# Patient Record
Sex: Female | Born: 1990
Health system: Southern US, Community
[De-identification: ages and names within clinical notes are randomized; demographics above are authoritative.]

## PROBLEM LIST (undated history)

## (undated) DIAGNOSIS — E739 Lactose intolerance, unspecified: Secondary | ICD-10-CM

## (undated) DIAGNOSIS — F909 Attention-deficit hyperactivity disorder, unspecified type: Secondary | ICD-10-CM

## (undated) HISTORY — DX: Attention-deficit hyperactivity disorder, unspecified type: F90.9

## (undated) HISTORY — DX: Lactose intolerance, unspecified: E73.9

---
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
## 2016-09-03 ENCOUNTER — Other Ambulatory Visit: Payer: Self-pay | Admitting: Family Medicine

## 2016-09-03 ENCOUNTER — Other Ambulatory Visit (HOSPITAL_COMMUNITY)
Admission: RE | Admit: 2016-09-03 | Discharge: 2016-09-03 | Disposition: A | Discharge status: Home / Self Care | Payer: Self-pay | Source: Ambulatory Visit | Attending: Family Medicine | Admitting: Family Medicine

## 2016-09-03 DIAGNOSIS — Z01419 Encounter for gynecological examination (general) (routine) without abnormal findings: Secondary | ICD-10-CM | POA: Insufficient documentation

## 2016-09-08 LAB — CYTOLOGY - PAP
Adequacy: ABSENT
Diagnosis: NEGATIVE

## 2019-05-24 ENCOUNTER — Encounter (INDEPENDENT_AMBULATORY_CARE_PROVIDER_SITE_OTHER): Payer: Self-pay | Admitting: Family Medicine

## 2019-05-24 ENCOUNTER — Ambulatory Visit (INDEPENDENT_AMBULATORY_CARE_PROVIDER_SITE_OTHER): Payer: No Typology Code available for payment source | Admitting: Family Medicine

## 2019-05-24 ENCOUNTER — Other Ambulatory Visit: Payer: Self-pay

## 2019-05-24 VITALS — BP 125/95 | HR 91 | Temp 98.2°F | Ht 62.0 in | Wt 218.0 lb

## 2019-05-24 DIAGNOSIS — Z0289 Encounter for other administrative examinations: Secondary | ICD-10-CM

## 2019-05-24 DIAGNOSIS — Z9189 Other specified personal risk factors, not elsewhere classified: Secondary | ICD-10-CM | POA: Diagnosis not present

## 2019-05-24 DIAGNOSIS — E559 Vitamin D deficiency, unspecified: Secondary | ICD-10-CM | POA: Diagnosis not present

## 2019-05-24 DIAGNOSIS — R0602 Shortness of breath: Secondary | ICD-10-CM

## 2019-05-24 DIAGNOSIS — R5383 Other fatigue: Secondary | ICD-10-CM | POA: Diagnosis not present

## 2019-05-24 DIAGNOSIS — Z6841 Body Mass Index (BMI) 40.0 and over, adult: Secondary | ICD-10-CM

## 2019-05-24 DIAGNOSIS — Z1331 Encounter for screening for depression: Secondary | ICD-10-CM | POA: Diagnosis not present

## 2019-05-24 NOTE — Progress Notes (Signed)
.  Office: [phone]  /  Fax: [phone]   HPI:   Chief Complaint: OBESITY  Norma SpencerShyla Hayden Cohen (MR# [id number]) is a 28 y.o. female who presents on 05/24/2019 for obesity evaluation and treatment. Current BMI is Body mass index is 39.87 kg/m. Norma Cohen has struggled with obesity for years and has been unsuccessful in either losing weight or maintaining long term weight loss. Norma Cohen attended our information session and states she is currently in the action stage of change and ready to dedicate time achieving and maintaining a healthier weight.   Norma Cohen heard about our clinic from her mom. She states she has gluten and lactose sensitivity. When she wakes up, she is doing Triscuits with mozzarella cheese (unsure of amount), and coffee (felt satisfied) until around 1 pm. For dinner, she is doing a bowl of Honey nut Cheerios with almond milk or a bowl of lucky charms with almond milk (felt full).  Norma Cohen states her family eats meals together she thinks her family will eat healthier with  her she struggles with family and or coworkers weight loss sabotage her desired weight loss is 68 lbs she started gaining weight in junior year of college her heaviest weight ever was 223.6 lbs she has significant food cravings issues  she skips meals frequently she is trying to eat vegetarian she is trying to eat vegan she is frequently drinking liquids with calories she frequently makes poor food choices she has problems with excessive hunger  she frequently eats larger portions than normal  she has binge eating behaviors she struggles with emotional eating    Fatigue Norma Cohen is lower than it should be. This has worsened with weight gain and has not worsened recently. Norma Cohen admits to daytime somnolence and  admits to waking up still tired. Patient is at risk for obstructive sleep apnea. Norma Cohen has a history of symptoms of daytime fatigue. Patient generally gets 6 hours of sleep per night, and  states they generally have nightime awakenings. Snoring is present. Apneic episodes are not present. Epworth Sleepiness Score is 4.  Dyspnea on exertion Norma Cohen notes increasing shortness of breath with exercising and seems to be worsening over time with weight gain. She notes getting out of breath sooner with activity than she used to. This has not gotten worse recently. EKG-normal sinus rhythm at 80 BPM. Norma Cohen denies orthopnea.  Vitamin D Deficiency Norma Cohen has a diagnosis of vitamin D deficiency. She is not currently taking Vit D, and diagnosis is likely secondary to obesity. She notes fatigue and denies nausea, vomiting or muscle weakness.  At risk for osteopenia and osteoporosis Norma Cohen is at higher risk of osteopenia and osteoporosis due to vitamin D deficiency.   Depression Screen Norma Cohen (modified PHQ-9) score was  Depression screen PHQ 2/9 05/24/2019  Decreased Interest 2  Down, Depressed, Hopeless 1  PHQ - 2 Score 3  Altered sleeping 3  Tired, decreased Cohen 3  Change in appetite 2  Feeling bad or failure about yourself  2  Trouble concentrating 2  Moving slowly or fidgety/restless 1  Suicidal thoughts 0  PHQ-9 Score 16  Difficult doing work/chores Somewhat difficult    ASSESSMENT AND PLAN:  Other fatigue - Plan: EKG 12-Lead, Comprehensive metabolic panel, CBC With Differential, Vitamin B12, Folate, Hemoglobin A1c, Insulin, random, Lipid Panel With LDL/HDL Ratio, T3, T4, free, TSH  SOB (shortness of breath) on exertion - Plan: Comprehensive metabolic panel, CBC With Differential, Vitamin B12, Folate, Hemoglobin A1c, Insulin, random,  Lipid Panel With LDL/HDL Ratio, T3, T4, free, TSH  Vitamin D deficiency - Plan: VITAMIN D 25 Hydroxy (Vit-D Deficiency, Fractures)  Depression screening  At risk for osteopenia  Class 3 severe obesity with serious comorbidity and body mass index (BMI) of 40.0 to 44.9 in adult, unspecified obesity type (HCC)  PLAN:  Fatigue  Norma Cohen was informed that her fatigue may be related to obesity, depression or many other causes. Labs will be ordered, and in the meanwhile Norma Cohen has agreed to work on diet, exercise and weight loss to help with fatigue. Proper sleep hygiene was discussed including the need for 7-8 hours of quality sleep each night. A sleep study was not ordered based on symptoms and Epworth score.  Dyspnea on exertion Norma Cohen's shortness of breath appears to be obesity related and exercise induced. She has agreed to work on weight loss and gradually increase exercise to treat her exercise induced shortness of breath. If Norma Cohen follows our instructions and loses weight without improvement of her shortness of breath, we will plan to refer to pulmonology. We will monitor this condition regularly. Norma Cohen agrees to this plan.  Vitamin D Deficiency Norma Cohen was informed that low vitamin D levels contributes to fatigue and are associated with obesity, breast, and colon cancer. She will follow up for routine testing of vitamin D, at least 2-3 times per year. She was informed of the risk of over-replacement of vitamin D and agrees to not increase her dose unless she discusses this with Norma Cohen first. We will check Vit D level today. Norma Cohen agrees to follow up with our clinic in 2 weeks.  At risk for osteopenia and osteoporosis Norma Cohen was given extended (15 minutes) osteoporosis prevention counseling today. Norma Cohen is at risk for osteopenia and osteoporsis due to her vitamin D deficiency. She was encouraged to take her vitamin D and follow her higher calcium diet and increase strengthening exercise to help strengthen her bones and decrease her risk of osteopenia and osteoporosis.  Depression Screen Norma Cohen had a strongly positive depression screening. Depression is commonly associated with obesity and often results in emotional eating behaviors. We will monitor this closely and work on CBT to help improve the non-hunger eating patterns. Referral  to Psychology may be required if no improvement is seen as she continues in our clinic.  Obesity Norma Cohen is currently in the action stage of change and her goal is to continue with weight loss efforts She has agreed to follow the Category 4 plan Norma Cohen has been instructed to work up to a goal of 150 minutes of combined cardio and strengthening exercise per week for weight loss and overall health benefits. We discussed the following Behavioral Modification Strategies today: increasing lean protein intake, increasing vegetables and work on meal planning and easy cooking plans, keeping healthy foods in the home, and planning for success  Norma Cohen has agreed to follow up with our clinic in 2 weeks. She was informed of the importance of frequent follow up visits to maximize her success with intensive lifestyle modifications for her multiple health conditions. She was informed we would discuss her lab results at her next visit unless there is a critical issue that needs to be addressed sooner. Norma Cohen agreed to keep her next visit at the agreed upon time to discuss these results.  ALLERGIES: Allergies  Allergen Reactions  . Gluten Meal   . Lactose Intolerance (Gi)   . Penicillins Rash    MEDICATIONS: Current Outpatient Medications on File Prior to Visit  Medication  Sig Dispense Refill  . ibuprofen (ADVIL) 200 MG tablet Take 200 mg by mouth every 6 (six) hours as needed.    . loratadine (CLARITIN) 10 MG tablet Take 10 mg by mouth daily.     No current facility-administered medications on file prior to visit.     PAST MEDICAL HISTORY: Past Medical History:  Diagnosis Date  . ADHD (attention deficit hyperactivity disorder)   . Lactose intolerance     PAST SURGICAL HISTORY: History reviewed. No pertinent surgical history.  SOCIAL HISTORY: Social History   Tobacco Use  . Smoking status: Never Smoker  . Smokeless tobacco: Never Used  Substance Use Topics  . Alcohol use: Not on file  . Drug  use: Not on file    FAMILY HISTORY: Family History  Problem Relation Age of Onset  . Diabetes Mother   . Anxiety disorder Mother   . Obesity Mother   . Diabetes Father   . Hypertension Father   . Cancer Father   . Obesity Father     ROS: Review of Systems  Constitutional: Positive for malaise/fatigue. Negative for weight loss.       + Trouble sleeping  HENT:       + Nasal stuffiness  Respiratory: Positive for shortness of breath (with exertion).   Cardiovascular: Negative for orthopnea.  Gastrointestinal: Negative for nausea and vomiting.  Musculoskeletal: Positive for back pain.       Negative muscle weakness + Neck stiffness + Muscle stiffness  Psychiatric/Behavioral:       + Stress    PHYSICAL EXAM: Blood pressure (!) 125/95, pulse 91, temperature 98.2 F (36.8 C), temperature source Oral, height 5\' 2"  (1.575 m), weight 218 lb (98.9 kg), last menstrual period 05/09/2019, SpO2 98 %. Body mass index is 39.87 kg/m. Physical Exam Vitals signs reviewed.  Constitutional:      Appearance: Normal appearance. She is obese.  HENT:     Head: Normocephalic and atraumatic.     Nose: Nose normal.  Eyes:     General: No scleral icterus.    Extraocular Movements: Extraocular movements intact.  Neck:     Musculoskeletal: Normal range of motion and neck supple.     Comments: No thyromegaly present Cardiovascular:     Rate and Rhythm: Normal rate and regular rhythm.     Pulses: Normal pulses.     Heart sounds: Normal heart sounds.  Pulmonary:     Effort: Pulmonary effort is normal. No respiratory distress.     Breath sounds: Normal breath sounds.  Abdominal:     Palpations: Abdomen is soft.     Tenderness: There is no abdominal tenderness.     Comments: + Obesity  Musculoskeletal: Normal range of motion.     Right lower leg: No edema.     Left lower leg: No edema.  Skin:    General: Skin is warm and dry.  Neurological:     Mental Status: She is alert and oriented to  person, place, and time.     Coordination: Coordination normal.  Psychiatric:        Cohen and Affect: Cohen normal.        Behavior: Behavior normal.     RECENT LABS AND TESTS: BMET No results found for: NA, K, CL, CO2, GLUCOSE, BUN, CREATININE, CALCIUM, GFRNONAA, GFRAA No results found for: HGBA1C No results found for: INSULIN CBC No results found for: WBC, RBC, HGB, HCT, PLT, MCV, MCH, MCHC, RDW, LYMPHSABS, MONOABS, EOSABS, BASOSABS Iron/TIBC/Ferritin/ %Sat No  results found for: IRON, TIBC, FERRITIN, IRONPCTSAT Lipid Panel  No results found for: CHOL, TRIG, HDL, CHOLHDL, VLDL, LDLCALC, LDLDIRECT Hepatic Function Panel  No results found for: PROT, ALBUMIN, AST, ALT, ALKPHOS, BILITOT, BILIDIR, IBILI No results found for: TSH Vitamin D No recent labs  ECG  shows NSR with a rate of 80 BPM INDIRECT CALORIMETER done today shows a VO2 of 322 and a REE of 2243. Her calculated basal metabolic rate is [id number] thus her basal metabolic rate is better than expected.       OBESITY BEHAVIORAL INTERVENTION VISIT  Today's visit was # 1   Starting weight: 218 lbs Starting date: 05/24/2019 Today's weight : 218 lbs Today's date: 05/24/2019 Total lbs lost to date: 0    ASK: We discussed the diagnosis of obesity with Norma SpencerShyla Hayden Langsam today and Norma Cohen agreed to give Norma Cohen permission to discuss obesity behavioral modification therapy today.  ASSESS: Norma Cohen has the diagnosis of obesity and her BMI today is 39.86 Norma Cohen is in the action stage of change   ADVISE: Norma Cohen was educated on the multiple health risks of obesity as well as the benefit of weight loss to improve her health. She was advised of the need for long term treatment and the importance of lifestyle modifications to improve her current health and to decrease her risk of future health problems.  AGREE: Multiple dietary modification options and treatment options were discussed and  Norma Cohen agreed to follow the recommendations  documented in the above note.  ARRANGE: Norma Cohen was educated on the importance of frequent visits to treat obesity as outlined per CMS and USPSTF guidelines and agreed to schedule her next follow up appointment today.   I, Burt KnackSharon Martin, am acting as transcriptionist for Debbra RidingAlexandria Kadolph, MD   I have reviewed the above documentation for accuracy and completeness, and I agree with the above. - Debbra RidingAlexandria Kadolph, MD

## 2019-05-25 LAB — FOLATE

## 2019-05-29 LAB — LIPID PANEL WITH LDL/HDL RATIO

## 2019-05-29 LAB — T4, FREE

## 2019-05-29 LAB — VITAMIN B12

## 2019-05-29 LAB — CBC WITH DIFFERENTIAL
Basophils Absolute: 0 x10E3/uL (ref 0.0–0.2)
Basos: 0 %
EOS (ABSOLUTE): 0.1 x10E3/uL (ref 0.0–0.4)
Eos: 1 %
Hematocrit: 42.2 % (ref 34.0–46.6)
Hemoglobin: 13.9 g/dL (ref 11.1–15.9)
Immature Grans (Abs): 0 x10E3/uL (ref 0.0–0.1)
Immature Granulocytes: 0 %
Lymphocytes Absolute: 1.8 x10E3/uL (ref 0.7–3.1)
Lymphs: 19 %
MCH: 27.9 pg (ref 26.6–33.0)
MCHC: 32.9 g/dL (ref 31.5–35.7)
MCV: 85 fL (ref 79–97)
Monocytes Absolute: 0.5 x10E3/uL (ref 0.1–0.9)
Monocytes: 5 %
Neutrophils Absolute: 7.2 x10E3/uL — ABNORMAL HIGH (ref 1.4–7.0)
Neutrophils: 75 %
RBC: 4.99 x10E6/uL (ref 3.77–5.28)
RDW: 13.3 % (ref 11.7–15.4)
WBC: 9.7 x10E3/uL (ref 3.4–10.8)

## 2019-05-29 LAB — COMPREHENSIVE METABOLIC PANEL WITH GFR

## 2019-05-29 LAB — T3

## 2019-05-29 LAB — HEMOGLOBIN A1C
Est. average glucose Bld gHb Est-mCnc: 105 mg/dL
Hgb A1c MFr Bld: 5.3 % (ref 4.8–5.6)

## 2019-05-29 LAB — INSULIN, RANDOM

## 2019-05-29 LAB — TSH

## 2019-05-29 LAB — VITAMIN D 25 HYDROXY (VIT D DEFICIENCY, FRACTURES)

## 2019-05-30 NOTE — Progress Notes (Signed)
Office: [phone]  /  Fax: [phone]    Date: June 01, 2019   Appointment Start Time: 11:00am Duration: 35 minutes Provider: Glennie Isle, Psy.D. Type of Session: Intake for Individual Therapy  Location of Patient: Home Location of Provider: Provider's Home Type of Contact: Telepsychological Visit via [employer] WebEx  Informed Consent: Prior to proceeding with today's appointment, two pieces of identifying information were obtained from [name] to verify identity. In addition, Norma Cohen's physical location at the time of this appointment was obtained. Norma Cohen reported she was at home and provided the address. In the event of technical difficulties, Norma Cohen shared a phone number she could be reached at. Norma Cohen and this provider participated in today's telepsychological service. Also, Norma Cohen denied anyone else being present in the room or on the WebEx appointment.   The provider's role was explained to [name]. The provider reviewed and discussed issues of confidentiality, privacy, and limits therein (e.g., reporting obligations). In addition to verbal informed consent, written informed consent for psychological services was obtained from [name] prior to the initial intake interview. Written consent included information concerning the practice, financial arrangements, and confidentiality and patients' rights. Since the clinic is not a 24/7 crisis center, mental health emergency resources were shared, and the provider explained MyChart, e-mail, voicemail, and/or other messaging systems should be utilized only for non-emergency reasons. This provider also explained that information obtained during appointments will be placed in [name] medical record in a confidential manner and relevant information will be shared with other providers at [facility] that she meets with for coordination of care. Norma Cohen verbally acknowledged understanding of the aforementioned, and agreed to use mental  health emergency resources discussed if needed. Moreover, Norma Cohen agreed information may be shared with other [facility] providers as needed for coordination of care. By signing the service agreement document, Norma Cohen provided written consent for coordination of care.   Prior to initiating telepsychological services, Norma Cohen was provided with an informed consent document, which included the development of a safety plan (i.e., an emergency contact and emergency resources) in the event of an emergency/crisis. Norma Cohen expressed understanding of the rationale of the safety plan and provided consent for this provider to reach out to her emergency contact in the event of an emergency/crisis. Norma Cohen returned the completed consent form prior to today's appointment. This provider verbally reviewed the consent form during today's appointment prior to proceeding with the appointment. Norma Cohen verbally acknowledged understanding that she is ultimately responsible for understanding her insurance benefits as it relates to reimbursement of telepsychological and in-person services. This provider also reviewed confidentiality, as it relates to telepsychological services, as well as the rationale for telepsychological services. More specifically, this provider's clinic is limiting in-person visits due to COVID-19. Therapeutic services will resume to in-person appointments once deemed appropriate. Norma Cohen expressed understanding regarding the rationale for telepsychological services. In addition, this provider explained the telepsychological services informed consent document would be considered an addendum to the initial consent document/service agreement. Norma Cohen verbally consented to proceed.   Chief Complaint/HPI: Norma Cohen was referred by Dr. Ilene Qua. During the initial appointment with Dr. Ilene Qua at [name] on May 24, 2019, Norma Cohen reported experiencing the following: significant food  cravings issues , frequently drinking liquids with calories, frequently making poor food choices, frequently eating larger portions than normal , binge eating behaviors, struggling with emotional eating, skipping meals frequently and having problems with excessive hunger.   During today's appointment, Norma Cohen was verbally administered a  questionnaire assessing various behaviors related to emotional eating. Norma Cohen endorsed the following: overeat when you are celebrating, experience food cravings on a regular basis, eat certain foods when you are anxious, stressed, depressed, or your feelings are hurt, use food to help you cope with emotional situations, find food is comforting to you, overeat when you are worried about something, overeat frequently when you are bored or lonely and eat as a reward. She shared she craves sweets and salty snacks (e.g., ice cream and candies). Norma Cohen stated the onset of emotional eating was during the second year of college due to mental health concerns. Currently, she described the frequency of emotional eating as "every day" when the pandemic started. She denied current engagement in emotional eating. In addition, Norma Cohen endorsed a history of binge eating. Norma Cohen indicated it was "mainly in college." She recalled sitting down with boxes of foods and "eating way more than [she] intended to." Norma Cohen denied feeling out of control when engaging in the aforementioned. Norma Cohen denied a history of restricting food intake, purging and engagement in other compensatory strategies, and has never been diagnosed with an eating disorder. She also denied a history of treatment for emotional eating. Moreover, Norma Cohen indicated stress of any kind, sadness, and loneliness triggers emotional eating, whereas calling her parents or boyfriend makes emotional eating better. Furthermore, Norma Cohen denied other problems of concern.    Mental Status Examination:  Appearance: neat Behavior: cooperative Mood:  euthymic Affect: mood congruent Speech: normal in rate, volume, and tone Eye Contact: appropriate Psychomotor Activity: appropriate Thought Process: linear, logical, and goal directed  Content/Perceptual Disturbances: denies suicidal and homicidal ideation, plan, and intent and no hallucinations, delusions, bizarre thinking or behavior reported or observed Orientation: time, person, place and purpose of appointment Cognition/Sensorium: memory, attention, language, and fund of knowledge intact  Insight: good Judgment: good  Family & Psychosocial History: Imberly reported she has a boyfriend and does not have any children. She indicated she is currently not employed. Additionally, Milanni shared her highest level of education obtained is a bachelor's degree. Currently, 25 social support system consists of her brothers, parents, boyfriend, and best friend. Moreover, Aireona stated she resides with her parents and brother.   Medical History:  Past Medical History:  Diagnosis Date   ADHD (attention deficit hyperactivity disorder)    Lactose intolerance    No past surgical history on file. Current Outpatient Medications on File Prior to Visit  Medication Sig Dispense Refill   ibuprofen (ADVIL) 200 MG tablet Take 200 mg by mouth every 6 (six) hours as needed.     loratadine (CLARITIN) 10 MG tablet Take 10 mg by mouth daily.     No current facility-administered medications on file prior to visit.   Icey reported suffering a concussion in 2013 while playing soccer. She denied LOC and received medical attention.   Mental Health History: Jackalynn reported experiencing anxiety in social situations and met with a therapist while in college. She clarified that was around 2014 until 2016. Zoraida noted, "It was only when I felt icky I would go see him." Also, she shared she was diagnosed by a family doctor with AD/HD while in high school. Adisynn denied a history of hospitalizations for psychiatric  concerns, and has never met with a psychiatrist. Alla reported she took Vyvanse during high school, but discontinued it in college. Lanetta endorsed a family history of mental health related concerns. More specifically, Gabbriella believes her father suffers from an anxiety disorder and takes medication. She believes  her paternal grandfather suffers from depression. Joycelin denied a trauma history, including psychological, physical  and sexual abuse, as well as neglect.   Atira described her typical mood as "pretty good." She added, "Lately it has been stress and anxiousness" due to fiances and lack of employment. Aside from concerns noted above and endorsed on the PHQ-9 and GAD-7, Eusebia reported experiencing decreased body image due to her weight, decreased motivation, and crying spells. Lilian endorsed current alcohol use. More specifically, she consumes alcohol "once or twice a month" with friends in the form of approximately 3 standard drinks. She denied current tobacco use. She denied illicit/recreational substance use. Regarding caffeine intake, Labrenda reported consuming 1 cup of coffee daily. Furthermore, Lorry denied experiencing the following: hopelessness, hallucinations and delusions, paranoia, mania and panic attacks. She endorsed a history of panic attacks, but denied recent panic attacks. She also denied history of and current suicidal ideation, plan, and intent; history of and current homicidal ideation, plan, and intent; and history of and current engagement in self-harm.  The following strengths were reported by [facility]: very empathic, very compassionate, love very openly, very kind to other people, and very friendly person. The following strengths were observed by this provider: ability to express thoughts and feelings during the therapeutic session, ability to establish and benefit from a therapeutic relationship, ability to learn and practice coping skills, willingness to work toward established goal(s)  with the clinic and ability to engage in reciprocal conversation.  Legal History: Kylei denied a history of legal involvement.   Structured Assessment Results: The Patient Health Questionnaire-9 (PHQ-9) is a self-report measure that assesses symptoms and severity of depression over the course of the last two weeks. Glori obtained a score of 8 suggesting mild depression. Leili finds the endorsed symptoms to be somewhat difficult. Little interest or pleasure in doing things 1  Feeling down, depressed, or hopeless 1  Trouble falling or staying asleep, or sleeping too much 2  Feeling tired or having little energy 1  Poor appetite or overeating 0  Feeling bad about yourself --- or that you are a failure or have let yourself or your family down 0  Trouble concentrating on things, such as reading the newspaper or watching television 2  Moving or speaking so slowly that other people could have noticed? Or the opposite --- being so fidgety or restless that you have been moving around a lot more than usual 1  Thoughts that you would be better off dead or hurting yourself in some way 0  PHQ-9 Score 8    The Generalized Anxiety Disorder-7 (GAD-7) is a brief self-report measure that assesses symptoms of anxiety over the course of the last two weeks. Keina obtained a score of 4 suggesting minimal anxiety. Jenita finds the endorsed symptoms to be somewhat difficult. Feeling nervous, anxious, on edge 0  Not being able to stop or control worrying 0  Worrying too much about different things 1  Trouble relaxing 2  Being so restless that it's hard to sit still 0  Becoming easily annoyed or irritable 1  Feeling afraid as if something awful might happen 0  GAD-7 Score 4   Interventions: A chart review was conducted prior to the clinical intake interview. The PHQ-9, and GAD-7 were verbally administered as well as a Mood and Food questionnaire to assess various behaviors related to emotional eating. Throughout  session, empathic reflections and validation was provided. Continuing treatment with this provider was discussed and a treatment goal was established.  Psychoeducation regarding emotional versus physical hunger was provided. Milaina was sent a handout via e-mail to utilize between now and the next appointment to increase awareness of hunger patterns and subsequent eating. Ellyce provided verbal consent during today's appointment for this provider to send the handout via e-mail.  Provisional DSM-5 Diagnosis: 311 (F32.8) Other Specified Depressive Disorder, Emotional Eating Behaviors  Plan: Shelene appears able and willing to participate as evidenced by collaboration on a treatment goal, engagement in reciprocal conversation, and asking questions as needed for clarification. The next appointment will be scheduled in three weeks, which will be via [employer]. The following treatment goal was established: decrease emotional eating. For the aforementioned goal, Retta can benefit from individual therapy sessions that are brief in duration for approximately four to six sessions. The treatment modality will be individual therapeutic services, including an eclectic therapeutic approach utilizing techniques from Cognitive Behavioral Therapy, Patient Centered Therapy, Dialectical Behavior Therapy, Acceptance and Commitment Therapy, Interpersonal Therapy, and Cognitive Restructuring. Therapeutic approach will include various interventions as appropriate, such as validation, support, mindfulness, thought defusion, reframing, psychoeducation, values assessment, and role playing. This provider will regularly review the treatment plan and medical chart to keep informed of status changes. Debrah expressed understanding and agreement with the initial treatment plan of care.

## 2019-06-01 ENCOUNTER — Other Ambulatory Visit: Payer: Self-pay

## 2019-06-01 ENCOUNTER — Ambulatory Visit (INDEPENDENT_AMBULATORY_CARE_PROVIDER_SITE_OTHER): Payer: No Typology Code available for payment source | Admitting: Psychology

## 2019-06-01 DIAGNOSIS — F3289 Other specified depressive episodes: Secondary | ICD-10-CM

## 2019-06-07 ENCOUNTER — Ambulatory Visit (INDEPENDENT_AMBULATORY_CARE_PROVIDER_SITE_OTHER): Payer: No Typology Code available for payment source | Admitting: Family Medicine

## 2019-06-07 ENCOUNTER — Other Ambulatory Visit: Payer: Self-pay

## 2019-06-07 VITALS — BP 128/75 | HR 80 | Temp 97.8°F | Ht 62.0 in | Wt 217.0 lb

## 2019-06-07 DIAGNOSIS — Z6839 Body mass index (BMI) 39.0-39.9, adult: Secondary | ICD-10-CM

## 2019-06-07 DIAGNOSIS — E88819 Insulin resistance, unspecified: Secondary | ICD-10-CM

## 2019-06-07 DIAGNOSIS — E8881 Metabolic syndrome: Secondary | ICD-10-CM

## 2019-06-07 DIAGNOSIS — R5383 Other fatigue: Secondary | ICD-10-CM | POA: Diagnosis not present

## 2019-06-07 DIAGNOSIS — E559 Vitamin D deficiency, unspecified: Secondary | ICD-10-CM

## 2019-06-07 DIAGNOSIS — R0602 Shortness of breath: Secondary | ICD-10-CM | POA: Diagnosis not present

## 2019-06-07 DIAGNOSIS — Z9189 Other specified personal risk factors, not elsewhere classified: Secondary | ICD-10-CM | POA: Diagnosis not present

## 2019-06-08 NOTE — Progress Notes (Signed)
Office: [phone]  /  Fax: [phone]   HPI:   Chief Complaint: OBESITY Norma Cohen is here to discuss her progress with her obesity treatment plan. She is on the  follow the Category 4 plan and is following her eating plan approximately 90 % of the time. She states she is exercising by walking at work for 5-7 minutes 4 times per week. [name] just started a new part time job at [employer]. She is getting approximately 24 hours per week. She is unable to get all food in at dinner, she can only get approximately 6 oz. She denies cravings.  Her weight is 217 lb (98.4 kg) today and has had a weight loss of 1 pounds over a period of 2 weeks since her last visit. She has lost 1 lbs since starting treatment with [place].  Vitamin D deficiency Johann has a diagnosis of vitamin D deficiency. She is not currently taking vit D and denies nausea, vomiting or muscle weakness. Previous labs cancelled.   Insulin Resistance Yvone has a diagnosis of insulin resistance based on her elevated fasting hgA1c of 5.3. Although Girtha's blood glucose readings are still under good control, insulin resistance puts her at greater risk of metabolic syndrome and diabetes. She is not taking metformin currently and continues to work on diet and exercise to decrease risk of diabetes. Previous Insulin labs cancelled.   Dyspnea with Exercise Zarielle notes increasing shortness of breath with exercising and seems to be worsening over time with weight gain. She notes getting out of breath sooner with activity than she used to. This has not gotten worse recently. [name] denies shortness of breath at rest or orthopnea. EKG shows normal sinus rhythm.   At risk for osteopenia and osteoporosis [name] is at higher risk of osteopenia and osteoporosis due to vitamin D deficiency.   ASSESSMENT AND PLAN:  Vitamin D deficiency - Plan: VITAMIN D 25 Hydroxy (Vit-D Deficiency, Fractures), Vitamin B12  Insulin resistance - Plan: Comprehensive  metabolic panel, Insulin, random  Shortness of breath on exertion - Plan: Lipid Panel With LDL/HDL Ratio  Other fatigue - Plan: Vitamin B12  At risk for osteoporosis  Class 2 severe obesity with serious comorbidity and body mass index (BMI) of 39.0 to 39.9 in adult, unspecified obesity type (HCC)  PLAN: Vitamin D Deficiency [name] was informed that low vitamin D levels contributes to fatigue and are associated with obesity, breast, and colon cancer. She agrees to continue to take prescription Vit D @50 ,000 IU every week and will follow up for routine testing of vitamin D, at least 2-3 times per year. She was informed of the risk of over-replacement of vitamin D and agrees to not increase her dose unless she discusses this with [place] first. We will check vit D level today   Insulin Resistance [name] will continue to work on weight loss, exercise, and decreasing simple carbohydrates in her diet to help decrease the risk of diabetes. We dicussed metformin including benefits and risks. She was informed that eating too many simple carbohydrates or too many calories at one sitting increases the likelihood of GI side effects. [name] agreed to follow up with [place] as directed to monitor her progress. Insulin, CMP, Lipid panel ordered today.   Dyspnea with exercise [name]'s shortness of breath appears to be obesity related and exercise induced. She has agreed to work on weight loss and gradually increase exercise to treat her exercise induced shortness of breath. If [name] follows our instructions and loses weight  without improvement of her shortness of breath, we will plan to refer to pulmonology. [name] agrees to this plan.Vitamin B12, Vitamin D, and Folate ordered today.   At risk for osteopenia and osteoporosis [name] was given extended  (15 minutes) osteoporosis prevention counseling today. [name] is at risk for osteopenia and osteoporosis due to her vitamin D deficiency. She was encouraged to take her vitamin D  and follow her higher calcium diet and increase strengthening exercise to help strengthen her bones and decrease her risk of osteopenia and osteoporosis.  Obesity [name] is currently in the action stage of change. As such, her goal is to continue with weight loss efforts She has agreed to follow the Category 4 plan [name] has been instructed to work up to a goal of 150 minutes of combined cardio and strengthening exercise per week for weight loss and overall health benefits. We discussed the following Behavioral Modification Strategies today:keeping healthy foods in the home, planning for success,  increasing lean protein intake, increasing vegetables and work on meal planning and easy cooking plans   [name] has agreed to follow up with our clinic in 2 weeks. She was informed of the importance of frequent follow up visits to maximize her success with intensive lifestyle modifications for her multiple health conditions.  ALLERGIES: Allergies  Allergen Reactions  . Gluten Meal   . Lactose Intolerance (Gi)   . Penicillins Rash    MEDICATIONS: Current Outpatient Medications on File Prior to Visit  Medication Sig Dispense Refill  . ibuprofen (ADVIL) 200 MG tablet Take 200 mg by mouth every 6 (six) hours as needed.    . loratadine (CLARITIN) 10 MG tablet Take 10 mg by mouth daily.     No current facility-administered medications on file prior to visit.     PAST MEDICAL HISTORY: Past Medical History:  Diagnosis Date  . ADHD (attention deficit hyperactivity disorder)   . Lactose intolerance     PAST SURGICAL HISTORY: No past surgical history on file.  SOCIAL HISTORY: Social History   Tobacco Use  . Smoking status: Never Smoker  . Smokeless tobacco: Never Used  Substance Use Topics  . Alcohol use: Not on file  . Drug use: Not on file    FAMILY HISTORY: Family History  Problem Relation Age of Onset  . Diabetes Mother   . Anxiety disorder Mother   . Obesity Mother   .  Diabetes Father   . Hypertension Father   . Cancer Father   . Obesity Father     ROS: Review of Systems  Constitutional: Positive for weight loss.  Respiratory: Positive for shortness of breath.   Gastrointestinal: Negative for nausea and vomiting.  Musculoskeletal:       Negative for muscle weakness     PHYSICAL EXAM: Blood pressure 128/75, pulse 80, temperature 97.8 F (36.6 C), temperature source Oral, height 5\' 2"  (1.575 m), weight 217 lb (98.4 kg), last menstrual period 06/07/2019, SpO2 98 %. Body mass index is 39.69 kg/m. Physical Exam Vitals signs reviewed.  Constitutional:      Appearance: Normal appearance. She is obese.  HENT:     Head: Normocephalic.     Nose: Nose normal.  Neck:     Musculoskeletal: Normal range of motion.  Cardiovascular:     Rate and Rhythm: Normal rate.  Pulmonary:     Effort: Pulmonary effort is normal.  Musculoskeletal: Normal range of motion.  Skin:    General: Skin is warm and dry.  Neurological:  Mental Status: She is alert and oriented to person, place, and time.  Psychiatric:        Mood and Affect: Mood normal.        Behavior: Behavior normal.     RECENT LABS AND TESTS: BMET    Component Value Date/Time   GLUCOSE CANCELED 05/24/2019 1434   Lab Results  Component Value Date   HGBA1C 5.3 05/24/2019   Lab Results  Component Value Date   INSULIN CANCELED 05/24/2019   CBC    Component Value Date/Time   WBC 9.7 05/24/2019 1434   RBC 4.99 05/24/2019 1434   HGB 13.9 05/24/2019 1434   HCT 42.2 05/24/2019 1434   MCV 85 05/24/2019 1434   MCH 27.9 05/24/2019 1434   MCHC 32.9 05/24/2019 1434   RDW 13.3 05/24/2019 1434   LYMPHSABS 1.8 05/24/2019 1434   EOSABS 0.1 05/24/2019 1434   BASOSABS 0.0 05/24/2019 1434   Iron/TIBC/Ferritin/ %Sat No results found for: IRON, TIBC, FERRITIN, IRONPCTSAT Lipid Panel     Component Value Date/Time   CHOL CANCELED 05/24/2019 1434   Hepatic Function Panel  No results found  for: PROT, ALBUMIN, AST, ALT, ALKPHOS, BILITOT, BILIDIR, IBILI    Component Value Date/Time   TSH CANCELED 05/24/2019 1434      OBESITY BEHAVIORAL INTERVENTION VISIT  Today's visit was # 2   Starting weight: 218 lb Starting date: 05/24/19 Today's weight : Weight: 217 lb (98.4 kg)  Today's date: 06/07/19 Total lbs lost to date: 1 At least 15 minutes were spent on discussing the following behavioral intervention visit.   ASK: We discussed the diagnosis of obesity with Junie SpencerShyla Hayden Dipinto today and Dameka agreed to give us permission to discuss obesity behavioral modification therapy today.  ASSESS: [name] has the diagnosis of obesity and her BMI today is 39.68 Breyah is in the action stage of change   ADVISE: [name] was educated on the multiple health risks of obesity as well as the benefit of weight loss to improve her health. She was advised of the need for long term treatment and the importance of lifestyle modifications to improve her current health and to decrease her risk of future health problems.  AGREE: Multiple dietary modification options and treatment options were discussed and  Adiel agreed to follow the recommendations documented in the above note.  ARRANGE: [name] was educated on the importance of frequent visits to treat obesity as outlined per CMS and USPSTF guidelines and agreed to schedule her next follow up appointment today.  I, Jeralene PetersAshleigh Haynes, am acting as transcriptionist for Debbra RidingAlexandria Kadolph, MD   I have reviewed the above documentation for accuracy and completeness, and I agree with the above. - Debbra RidingAlexandria Kadolph, MD

## 2019-06-14 NOTE — Progress Notes (Signed)
Office: [phone]  /  Fax: [phone]    Date: June 22, 2019   Appointment Start Time: 10:00am Duration: 27 minutes Provider: Lawerance CruelGaytri Fenton Candee, Psy.D. Type of Session: Individual Therapy  Location of Patient: Home Location of Provider: [facility] Type of Contact: Telepsychological Visit via [employer]   Session Content: Norma Cohen is a 28 y.o. female presenting via [employer] Web[employer] for a follow-up appointment to address the previously established treatment goal of decreasing emotional eating. Today's appointment was a telepsychological visit, as this provider's clinic is seeing a limited number of patients for in-person visits due to COVID-19. Therapeutic services will resume to in-person appointments once deemed appropriate. Norma Cohen expressed understanding regarding the rationale for telepsychological services, and provided verbal consent for today's appointment. Prior to proceeding with today's appointment, Norma Cohen's physical location at the time of this appointment was obtained. Norma Cohen reported she was at home and provided the address. In the event of technical difficulties, Norma Cohen shared a phone number she could be reached at. Norma Cohen and this provider participated in today's telepsychological service. Also, Norma Cohen denied anyone else being present in the room or on the WebEx appointment.  This provider conducted a brief check-in and verbally administered the PHQ-9 and GAD-7. Norma Cohen shared, "I have a job now." She added, "I've been a busy little bee." She also reflected there was a day where she "cried for 2 hours" as she felt she was not where she wanted to be in life. She added, "I feel a lot better now." Elysabeth further reflected "there was day in particular" that she was stressed, and she experienced emotional hunger. Her increased awareness was positively reinforced.  Psychoeducation regarding triggers for emotional eating was provided. Norma Cohen was provided a handout, and encouraged to  utilize the handout between now and the next appointment to increase awareness of triggers and frequency. Norma Cohen agreed. This provider also discussed behavioral strategies for specific triggers, such as placing the utensil down when conversing to avoid mindless eating. Norma Cohen provided verbal consent during today's appointment for this provider to send the handout about triggers via e-mail. Norma Cohen was receptive to today's session as evidenced by openness to sharing, responsiveness to feedback, and willingness to explore triggers for emotional eating.  Mental Status Examination:  Appearance: neat Behavior: cooperative Mood: euthymic Affect: mood congruent Speech: normal in rate, volume, and tone Eye Contact: appropriate Psychomotor Activity: appropriate Thought Process: linear, logical, and goal directed  Content/Perceptual Disturbances: no hallucinations, delusions, bizarre thinking or behavior reported or observed and no evidence of suicidal and homicidal ideation, plan, and intent Orientation: time, person, place and purpose of appointment Cognition/Sensorium: memory, attention, language, and fund of knowledge intact  Insight: good Judgment: good  Structured Assessment Results: The Patient Health Questionnaire-9 (PHQ-9) is a self-report measure that assesses symptoms and severity of depression over the course of the last two weeks. Norma Cohen obtained a score of 4 suggesting minimal depression. Norma Cohen finds the endorsed symptoms to be somewhat difficult. Little interest or pleasure in doing things 0  Feeling down, depressed, or hopeless 0  Trouble falling or staying asleep, or sleeping too much 1  Feeling tired or having little energy 1  Poor appetite or overeating 0  Feeling bad about yourself --- or that you are a failure or have let yourself or your family down 1  Trouble concentrating on things, such as reading the newspaper or watching television 1  Moving or speaking so slowly that other  people could have noticed? Or the opposite ---  being so fidgety or restless that you have been moving around a lot more than usual 0  Thoughts that you would be better off dead or hurting yourself in some way 0  PHQ-9 Score 4    The Generalized Anxiety Disorder-7 (GAD-7) is a brief self-report measure that assesses symptoms of anxiety over the course of the last two weeks. Norma Cohen obtained a score of 1 suggesting minimal anxiety. Norma Cohen finds the endorsed symptoms to be not difficult at all. Feeling nervous, anxious, on edge 0  Not being able to stop or control worrying 0  Worrying too much about different things 1  Trouble relaxing 0  Being so restless that it's hard to sit still 0  Becoming easily annoyed or irritable 0  Feeling afraid as if something awful might happen 0  GAD-7 Score 1   Interventions:  Conducted a brief chart review Verbal administration of PHQ-9 and GAD-7 for symptom monitoring Provided empathic reflections and validation Reviewed content from the previous session Psychoeducation provided regarding triggers for emotional eating Provided positive reinforcement Focused on rapport building Employed supportive psychotherapy interventions to facilitate reduced distress, and to improve coping skills with identified stressors  DSM-5 Diagnosis: 311 (F32.8) Other Specified Depressive Disorder, Emotional Eating Behaviors  Treatment Goal & Progress: During the initial appointment with this provider, the following treatment goal was established: decrease emotional eating. Progress is limited, as Norma Cohen has just begun treatment with this provider; however, she is receptive to the interaction and interventions and rapport is being established.   Plan: Norma Cohen continues to appear able and willing to participate as evidenced by engagement in reciprocal conversation, and asking questions for clarification as appropriate. The next appointment will be scheduled in 2-3 weeks, which will be  via [employer]. The next session will focus on the introduction of mindfulness.

## 2019-06-21 ENCOUNTER — Other Ambulatory Visit: Payer: Self-pay

## 2019-06-21 ENCOUNTER — Ambulatory Visit (INDEPENDENT_AMBULATORY_CARE_PROVIDER_SITE_OTHER): Payer: No Typology Code available for payment source | Admitting: Family Medicine

## 2019-06-21 VITALS — BP 133/93 | HR 86 | Temp 97.5°F | Ht 62.0 in | Wt 210.0 lb

## 2019-06-21 DIAGNOSIS — R5383 Other fatigue: Secondary | ICD-10-CM | POA: Diagnosis not present

## 2019-06-21 DIAGNOSIS — Z9189 Other specified personal risk factors, not elsewhere classified: Secondary | ICD-10-CM | POA: Diagnosis not present

## 2019-06-21 DIAGNOSIS — E559 Vitamin D deficiency, unspecified: Secondary | ICD-10-CM | POA: Diagnosis not present

## 2019-06-21 DIAGNOSIS — Z6838 Body mass index (BMI) 38.0-38.9, adult: Secondary | ICD-10-CM

## 2019-06-21 LAB — BASIC METABOLIC PANEL
BUN: 18 (ref 4–21)
Creatinine: 0.6 (ref 0.5–1.1)
Glucose: 91

## 2019-06-21 LAB — LIPID PANEL
Cholesterol: 139 (ref 0–200)
HDL: 37 (ref 35–70)
LDl/HDL Ratio: 2.4

## 2019-06-21 LAB — TSH: TSH: 3.02 (ref 0.41–5.90)

## 2019-06-21 NOTE — Progress Notes (Signed)
Office: [phone]  /  Fax: [phone]   HPI:   Chief Complaint: OBESITY Trenna is here to discuss her progress with her obesity treatment plan. She is on the Category 4 plan and is following her eating plan approximately 90 % of the time. She states she is walking and doing some remodeling. Stefana has been working part time and has found this to be difficult to get adjusted to, secondary to her schedule not being consistent. She notes occasionally skipping meals, and occasional mental hunger especially secondary to cravings. Her weight is 210 lb (95.3 kg) today and has had a weight loss of 7 pounds over a period of 2 weeks since her last visit. She has lost 8 lbs since starting treatment with [place].  Vitamin D Deficiency Margaret has a diagnosis of vitamin D deficiency. She is not currently taking Vit D supplementation. She notes fatigue and denies nausea, vomiting or muscle weakness.  At risk for osteopenia and osteoporosis Jaylen is at higher risk of osteopenia and osteoporosis due to vitamin D deficiency.   Fatigue Maicey voices that she is still experiencing fatigue throughout the day.  ASSESSMENT AND PLAN:  Vitamin D deficiency - Plan: VITAMIN D 25 Hydroxy (Vit-D Deficiency, Fractures)  Other fatigue - Plan: Comprehensive metabolic panel, Hemoglobin A1c, Insulin, random, Vitamin B12, Lipid Panel With LDL/HDL Ratio, T3, T4, free, TSH  At risk for osteoporosis  Class 2 severe obesity with serious comorbidity and body mass index (BMI) of 38.0 to 38.9 in adult, unspecified obesity type (HCC)  PLAN:  Vitamin D Deficiency Kaylene was informed that low vitamin D levels contributes to fatigue and are associated with obesity, breast, and colon cancer. She will follow up for routine testing of vitamin D, at least 2-3 times per year. She was informed of the risk of over-replacement of vitamin D and agrees to not increase her dose unless she discusses this with [place] first. We will check Vit D  level today. Corina agrees to follow up with our clinic in 2 weeks.  At risk for osteopenia and osteoporosis Damyia was given extended (15 minutes) osteoporosis prevention counseling today. Markea is at risk for osteopenia and osteoporsis due to her vitamin D deficiency. She was encouraged to take her vitamin D and follow her higher calcium diet and increase strengthening exercise to help strengthen her bones and decrease her risk of osteopenia and osteoporosis.  Fatigue Andreika was informed that her fatigue may be related to obesity, depression or many other causes. We will check CMP, insulin, thyroid panel, B12 level, and FLP today, and in the meanwhile Maricella has agreed to work on diet, exercise and weight loss to help with fatigue. Proper sleep hygiene was discussed including the need for 7-8 hours of quality sleep each night.   Obesity Korissa is currently in the action stage of change. As such, her goal is to continue with weight loss efforts She has agreed to follow the Category 4 plan Myalee has been instructed to work up to a goal of 150 minutes of combined cardio and strengthening exercise per week for weight loss and overall health benefits. We discussed the following Behavioral Modification Strategies today: increasing lean protein intake, increasing vegetables and work on meal planning and easy cooking plans, no skipping meals, keeping healthy foods in the home, better snacking choices, and planning for success   Deeandra has agreed to follow up with our clinic in 2 weeks. She was informed of the importance of frequent follow up visits  to maximize her success with intensive lifestyle modifications for her multiple health conditions.  ALLERGIES: Allergies  Allergen Reactions  . Gluten Meal   . Lactose Intolerance (Gi)   . Penicillins Rash    MEDICATIONS: Current Outpatient Medications on File Prior to Visit  Medication Sig Dispense Refill  . ibuprofen (ADVIL) 200 MG tablet Take 200 mg  by mouth every 6 (six) hours as needed.    . loratadine (CLARITIN) 10 MG tablet Take 10 mg by mouth daily.     No current facility-administered medications on file prior to visit.     PAST MEDICAL HISTORY: Past Medical History:  Diagnosis Date  . ADHD (attention deficit hyperactivity disorder)   . Lactose intolerance     PAST SURGICAL HISTORY: No past surgical history on file.  SOCIAL HISTORY: Social History   Tobacco Use  . Smoking status: Never Smoker  . Smokeless tobacco: Never Used  Substance Use Topics  . Alcohol use: Not on file  . Drug use: Not on file    FAMILY HISTORY: Family History  Problem Relation Age of Onset  . Diabetes Mother   . Anxiety disorder Mother   . Obesity Mother   . Diabetes Father   . Hypertension Father   . Cancer Father   . Obesity Father     ROS: Review of Systems  Constitutional: Positive for malaise/fatigue and weight loss.  Gastrointestinal: Negative for nausea and vomiting.  Musculoskeletal:       Negative muscle weakness    PHYSICAL EXAM: Blood pressure (!) 133/93, pulse 86, temperature (!) 97.5 F (36.4 C), temperature source Oral, height 5\' 2"  (1.575 m), weight 210 lb (95.3 kg), last menstrual period 06/07/2019, SpO2 99 %. Body mass index is 38.41 kg/m. Physical Exam Vitals signs reviewed.  Constitutional:      Appearance: Normal appearance. She is obese.  Cardiovascular:     Rate and Rhythm: Normal rate.     Pulses: Normal pulses.  Pulmonary:     Effort: Pulmonary effort is normal.     Breath sounds: Normal breath sounds.  Musculoskeletal: Normal range of motion.  Skin:    General: Skin is warm and dry.  Neurological:     Mental Status: She is alert and oriented to person, place, and time.  Psychiatric:        Mood and Affect: Mood normal.        Behavior: Behavior normal.     RECENT LABS AND TESTS: BMET    Component Value Date/Time   GLUCOSE CANCELED 05/24/2019 1434   Lab Results  Component Value  Date   HGBA1C 5.3 05/24/2019   Lab Results  Component Value Date   INSULIN CANCELED 05/24/2019   CBC    Component Value Date/Time   WBC 9.7 05/24/2019 1434   RBC 4.99 05/24/2019 1434   HGB 13.9 05/24/2019 1434   HCT 42.2 05/24/2019 1434   MCV 85 05/24/2019 1434   MCH 27.9 05/24/2019 1434   MCHC 32.9 05/24/2019 1434   RDW 13.3 05/24/2019 1434   LYMPHSABS 1.8 05/24/2019 1434   EOSABS 0.1 05/24/2019 1434   BASOSABS 0.0 05/24/2019 1434   Iron/TIBC/Ferritin/ %Sat No results found for: IRON, TIBC, FERRITIN, IRONPCTSAT Lipid Panel     Component Value Date/Time   CHOL CANCELED 05/24/2019 1434   Hepatic Function Panel  No results found for: PROT, ALBUMIN, AST, ALT, ALKPHOS, BILITOT, BILIDIR, IBILI    Component Value Date/Time   TSH CANCELED 05/24/2019 1434  OBESITY BEHAVIORAL INTERVENTION VISIT  Today's visit was # 3   Starting weight: 218 lbs Starting date: 05/24/2019 Today's weight : 210 lbs Today's date: 06/21/2019 Total lbs lost to date: 8    ASK: We discussed the diagnosis of obesity with Junie Spencer today and Wilder Glade agreed to give [place] permission to discuss obesity behavioral modification therapy today.  ASSESS: Crisel has the diagnosis of obesity and her BMI today is 38.4 Olene is in the action stage of change   ADVISE: Adaly was educated on the multiple health risks of obesity as well as the benefit of weight loss to improve her health. She was advised of the need for long term treatment and the importance of lifestyle modifications to improve her current health and to decrease her risk of future health problems.  AGREE: Multiple dietary modification options and treatment options were discussed and  Michelyn agreed to follow the recommendations documented in the above note.  ARRANGE: Tanee was educated on the importance of frequent visits to treat obesity as outlined per CMS and USPSTF guidelines and agreed to schedule her next follow up appointment  today.  I, Burt Knack, am acting as transcriptionist for Debbra Riding, MD  I have reviewed the above documentation for accuracy and completeness, and I agree with the above. - Debbra Riding, MD

## 2019-06-22 ENCOUNTER — Ambulatory Visit (INDEPENDENT_AMBULATORY_CARE_PROVIDER_SITE_OTHER): Payer: No Typology Code available for payment source | Admitting: Psychology

## 2019-06-22 DIAGNOSIS — F3289 Other specified depressive episodes: Secondary | ICD-10-CM

## 2019-06-22 LAB — LIPID PANEL WITH LDL/HDL RATIO
Cholesterol, Total: 139 mg/dL (ref 100–199)
HDL: 37 mg/dL — ABNORMAL LOW (ref >39)
LDL Chol Calc (NIH): 88 mg/dL (ref 0–99)
LDL/HDL Ratio: 2.4 ratio (ref 0.0–3.2)
Triglycerides: 70 mg/dL (ref 0–149)
VLDL Cholesterol Cal: 14 mg/dL (ref 5–40)

## 2019-06-22 LAB — HEMOGLOBIN A1C
Est. average glucose Bld gHb Est-mCnc: 103 mg/dL
Hgb A1c MFr Bld: 5.2 % (ref 4.8–5.6)

## 2019-06-22 LAB — COMPREHENSIVE METABOLIC PANEL
ALT: 21 IU/L (ref 0–32)
AST: 17 IU/L (ref 0–40)
Albumin/Globulin Ratio: 1.6 (ref 1.2–2.2)
Albumin: 4.7 g/dL (ref 3.9–5.0)
Alkaline Phosphatase: 73 IU/L (ref 39–117)
BUN/Creatinine Ratio: 30 — ABNORMAL HIGH (ref 9–23)
BUN: 18 mg/dL (ref 6–20)
Bilirubin Total: 0.3 mg/dL (ref 0.0–1.2)
CO2: 18 mmol/L — ABNORMAL LOW (ref 20–29)
Calcium: 9.5 mg/dL (ref 8.7–10.2)
Chloride: 104 mmol/L (ref 96–106)
Creatinine, Ser: 0.61 mg/dL (ref 0.57–1.00)
GFR calc Af Amer: 146 mL/min/{1.73_m2} (ref >59)
GFR calc non Af Amer: 126 mL/min/{1.73_m2} (ref >59)
Globulin, Total: 2.9 g/dL (ref 1.5–4.5)
Glucose: 91 mg/dL (ref 65–99)
Potassium: 4.2 mmol/L (ref 3.5–5.2)
Sodium: 141 mmol/L (ref 134–144)
Total Protein: 7.6 g/dL (ref 6.0–8.5)

## 2019-06-22 LAB — T3: T3, Total: 136 ng/dL (ref 71–180)

## 2019-06-22 LAB — INSULIN, RANDOM: INSULIN: 31.7 u[IU]/mL — ABNORMAL HIGH (ref 2.6–24.9)

## 2019-06-22 LAB — TSH: TSH: 3.02 u[IU]/mL (ref 0.450–4.500)

## 2019-06-22 LAB — VITAMIN D 25 HYDROXY (VIT D DEFICIENCY, FRACTURES): Vit D, 25-Hydroxy: 27.9 ng/mL — ABNORMAL LOW (ref 30.0–100.0)

## 2019-06-22 LAB — T4, FREE: Free T4: 1.21 ng/dL (ref 0.82–1.77)

## 2019-06-22 LAB — VITAMIN B12: Vitamin B-12: 420 pg/mL (ref 232–1245)

## 2019-07-03 NOTE — Progress Notes (Signed)
Office: [phone]  /  Fax: [phone]    Date: July 12, 2019   Appointment Start Time: 12:05pm Duration: 25 minutes Provider: Glennie Isle, Psy.D. Type of Session: Individual Therapy  Location of Patient: Home Location of Provider: Provider's Home Type of Contact: Telepsychological Visit via [employer] Web[employer]   Session Content: Norma Cohen is a 28 y.o. female presenting via [place] for a follow-up appointment to address the previously established treatment goal of decreasing emotional eating. Today's appointment was a telepsychological visit, as this provider's clinic is seeing a limited number of patients for in-person visits due to COVID-19. Therapeutic services will resume to in-person appointments once deemed appropriate. Norma Cohen expressed understanding regarding the rationale for telepsychological services, and provided verbal consent for today's appointment. Prior to proceeding with today's appointment, Norma Cohen's physical location at the time of this appointment was obtained. Norma Cohen reported she was at home and provided the address. In the event of technical difficulties, Norma Cohen shared a phone number she could be reached at. Norma Cohen and this provider participated in today's telepsychological service. Also, Norma Cohen denied anyone else being present in the room or on the WebEx appointment.  This provider conducted a brief check-in and verbally administered the PHQ-9 and GAD-7. Norma Cohen shared she obtained employment with [employer], and noted she plans to continue working at [employer] on the weekends. Regarding eating, Norma Cohen reported an increase in snacking, but believes she continues to lose weight. She added she discussed the aforementioned with Dr. Adair Patter during their last appointment (July 04, 2019). She also described making better choices and engaging in portion control. Norma Cohen identified squelch urgency, fatigue, and boredom are frequent triggers for emotional eating.  Psychoeducation regarding mindfulness was provided to assist with coping. A handout was provided to Norma Cohen with further information regarding mindfulness, including exercises. This provider also explained the benefit of mindfulness as it relates to emotional eating. Norma Cohen was encouraged to engage in the provided exercises between now and the next appointment with this provider. Norma Cohen agreed. Norma Cohen was led through an exercise involving her senses. Norma Cohen provided verbal consent during today's appointment for this provider to send the handout on mindfulness via e-mail. Based on overall progress, frequency of appointments will be reduced.  Norma Cohen was receptive to today's session as evidenced by openness to sharing, responsiveness to feedback, and willingness to engage in mindfulness exercises.  Mental Status Examination: Of note, today's appointment was initiated 5 minutes late due to this provider. Appearance: neat Behavior: cooperative Mood: euthymic Affect: mood congruent Speech: normal in rate, volume, and tone Eye Contact: appropriate Psychomotor Activity: appropriate Thought Process: linear, logical, and goal directed  Content/Perceptual Disturbances: no hallucinations, delusions, bizarre thinking or behavior reported or observed and no evidence of suicidal and homicidal ideation, plan, and intent Orientation: time, person, place and purpose of appointment Cognition/Sensorium: memory, attention, language, and fund of knowledge intact  Insight: good Judgment: good  Structured Assessment Results: The Patient Health Questionnaire-9 (PHQ-9) is a self-report measure that assesses symptoms and severity of depression over the course of the last two weeks. Norma Cohen obtained a score of 5 suggesting mild depression. Norma Cohen finds the endorsed symptoms to be somewhat difficult. Little interest or pleasure in doing things 0  Feeling down, depressed, or hopeless 0  Trouble falling or staying asleep, or sleeping  too much 1  Feeling tired or having little energy 2  Poor appetite or overeating 0  Feeling bad about yourself --- or that you are a failure or have let yourself or your  family down 0  Trouble concentrating on things, such as reading the newspaper or watching television 1  Moving or speaking so slowly that other people could have noticed? Or the opposite --- being so fidgety or restless that you have been moving around a lot more than usual 1  Thoughts that you would be better off dead or hurting yourself in some way 0  PHQ-9 Score 5    The Generalized Anxiety Disorder-7 (GAD-7) is a brief self-report measure that assesses symptoms of anxiety over the course of the last two weeks. Norma Cohen obtained a score of 4 suggesting minimal anxiety. Norma Cohen finds the endorsed symptoms to be somewhat difficult. Feeling nervous, anxious, on edge 1  Not being able to stop or control worrying 0  Worrying too much about different things 1  Trouble relaxing 1  Being so restless that it's hard to sit still 1  Becoming easily annoyed or irritable 0  Feeling afraid as if something awful might happen 0  GAD-7 Score 4   Interventions:  Conducted a brief chart review Verbal administration of PHQ-9 and GAD-7 for symptom monitoring Provided empathic reflections and validation Reviewed content from the previous session Psychoeducation provided regarding mindfulness Engaged patient in a mindfulness exercise Employed supportive psychotherapy interventions to facilitate reduced distress, and to improve coping skills with identified stressors Employed acceptance and commitment interventions to emphasize mindfulness and acceptance without struggle  DSM-5 Diagnosis: 311 (F32.8) Other Specified Depressive Disorder, Emotional Eating Behaviors  Treatment Goal & Progress: During the initial appointment with this provider, the following treatment goal was established: decrease emotional eating. Norma Cohen has demonstrated progress  in her goal as evidenced by increased awareness of hunger patterns and triggers for emotional eating. Teniola also demonstrates willingness to engage in mindfulness exercises.  Plan: Norma Cohen continues to appear able and willing to participate as evidenced by engagement in reciprocal conversation, and asking questions for clarification as appropriate. The next appointment will be scheduled in one month, which will be via [employer]. The next session will focus further on mindfulness.

## 2019-07-04 ENCOUNTER — Other Ambulatory Visit: Payer: Self-pay

## 2019-07-04 ENCOUNTER — Encounter (INDEPENDENT_AMBULATORY_CARE_PROVIDER_SITE_OTHER): Payer: Self-pay | Admitting: Family Medicine

## 2019-07-04 ENCOUNTER — Telehealth (INDEPENDENT_AMBULATORY_CARE_PROVIDER_SITE_OTHER): Payer: No Typology Code available for payment source | Admitting: Family Medicine

## 2019-07-04 DIAGNOSIS — Z6838 Body mass index (BMI) 38.0-38.9, adult: Secondary | ICD-10-CM

## 2019-07-04 DIAGNOSIS — E559 Vitamin D deficiency, unspecified: Secondary | ICD-10-CM | POA: Diagnosis not present

## 2019-07-04 DIAGNOSIS — E8881 Metabolic syndrome: Secondary | ICD-10-CM | POA: Diagnosis not present

## 2019-07-04 MED ORDER — VITAMIN D (ERGOCALCIFEROL) 1.25 MG (50000 UNIT) PO CAPS: 50000.0000 [IU] | ORAL_CAPSULE | ORAL | 0 refills | Status: DC

## 2019-07-05 NOTE — Progress Notes (Signed)
Office: [phone]  /  Fax: [phone] TeleHealth Visit:  Norma Cohen has verbally consented to this TeleHealth visit today. The patient is located at home, the provider is located at the [facility] office. The participants in this visit include the listed provider and patient. The visit was conducted today via webex.  HPI:   Chief Complaint: OBESITY Norma Cohen is here to discuss her progress with her obesity treatment plan. She is on the Category 4 plan and is following her eating plan approximately 95 % of the time. She states she is exercising 0 minutes 0 times per week. Norma Cohen's weight is of 213 lbs today. She is following the plan for the most part. She denies being hungry but is having cravings especially for pizza. She is noticing her clothes are looser and she is able to fit into her size 16 clothes. She is not feeling well today.  We were unable to weigh the patient today for this TeleHealth visit. She feels as if she has gained 3 lbs since her last visit. She has lost 8 lbs since starting treatment with [place].  Vitamin D Deficiency Norma Cohen has a diagnosis of vitamin D deficiency. She is not on Vit D supplementation. Last Vit D level was of 27.9. She notes fatigue and denies nausea, vomiting or muscle weakness.  Insulin Resistance Norma Cohen has a diagnosis of insulin resistance based on her elevated fasting insulin level >5. Although [name]'s blood glucose readings are still under good control, insulin resistance puts her at greater risk of metabolic syndrome and diabetes. She is not taking metformin currently and notes carbohydrate cravings. She continues to work on diet and exercise to decrease risk of diabetes.  ASSESSMENT AND PLAN:  Vitamin D deficiency - Plan: Vitamin D, Ergocalciferol, (DRISDOL) 1.25 MG (50000 UT) CAPS capsule  Insulin resistance  Class 2 severe obesity with serious comorbidity and body mass index (BMI) of 38.0 to 38.9 in adult, unspecified  obesity type ([place])  PLAN:  Vitamin D Deficiency Norma Cohen was informed that low vitamin D levels contributes to fatigue and are associated with obesity, breast, and colon cancer. Norma Cohen agrees to start prescription Vit D 50,000 IU every week #4 with no refills. She will follow up for routine testing of vitamin D, at least 2-3 times per year. She was informed of the risk of over-replacement of vitamin D and agrees to not increase her dose unless she discusses this with [place] first. Norma Cohen agrees to follow up with our clinic in 2 weeks.  Insulin Resistance Norma Cohen will continue to work on weight loss, exercise, and decreasing simple carbohydrates in her diet to help decrease the risk of diabetes. We dicussed metformin including benefits and risks. She was informed that eating too many simple carbohydrates or too many calories at one sitting increases the likelihood of GI side effects. We will repeat labs in mid [place]. Norma Cohen agrees to follow up with [place] as directed to monitor her progress.  Obesity Norma Cohen is currently in the action stage of change. As such, her goal is to continue with weight loss efforts She has agreed to follow the Category 4 plan Norma Cohen has been instructed to work up to a goal of 150 minutes of combined cardio and strengthening exercise per week for weight loss and overall health benefits. We discussed the following Behavioral Modification Strategies today: increasing lean protein intake, increasing vegetables and work on meal planning and easy cooking plans, keeping healthy foods lin the home, and planning for success  Norma Cohen has agreed to follow up with our clinic in 2 weeks. She was informed of the importance of frequent follow up visits to maximize her success with intensive lifestyle modifications for her multiple health conditions.  ALLERGIES: Allergies  Allergen Reactions  . Gluten Meal   . Lactose Intolerance (Gi)   . Penicillins Rash    MEDICATIONS: Current Outpatient  Medications on File Prior to Visit  Medication Sig Dispense Refill  . ibuprofen (ADVIL) 200 MG tablet Take 200 mg by mouth every 6 (six) hours as needed.    . loratadine (CLARITIN) 10 MG tablet Take 10 mg by mouth daily.     No current facility-administered medications on file prior to visit.     PAST MEDICAL HISTORY: Past Medical History:  Diagnosis Date  . ADHD (attention deficit hyperactivity disorder)   . Lactose intolerance     PAST SURGICAL HISTORY: History reviewed. No pertinent surgical history.  SOCIAL HISTORY: Social History   Tobacco Use  . Smoking status: Never Smoker  . Smokeless tobacco: Never Used  Substance Use Topics  . Alcohol use: Not on file  . Drug use: Not on file    FAMILY HISTORY: Family History  Problem Relation Age of Onset  . Diabetes Mother   . Anxiety disorder Mother   . Obesity Mother   . Diabetes Father   . Hypertension Father   . Cancer Father   . Obesity Father     ROS: Review of Systems  Constitutional: Positive for malaise/fatigue. Negative for weight loss.  Gastrointestinal: Negative for nausea and vomiting.  Musculoskeletal:       Negative muscle weakness    PHYSICAL EXAM: Pt in no acute distress  RECENT LABS AND TESTS: BMET    Component Value Date/Time   NA 141 06/21/2019 1151   K 4.2 06/21/2019 1151   CL 104 06/21/2019 1151   CO2 18 (L) 06/21/2019 1151   GLUCOSE 91 06/21/2019 1151   BUN 18 06/21/2019 1151   CREATININE 0.61 06/21/2019 1151   CALCIUM 9.5 06/21/2019 1151   GFRNONAA 126 06/21/2019 1151   GFRAA 146 06/21/2019 1151   Lab Results  Component Value Date   HGBA1C 5.2 06/21/2019   HGBA1C 5.3 05/24/2019   Lab Results  Component Value Date   INSULIN 31.7 (H) 06/21/2019   INSULIN CANCELED 05/24/2019   CBC    Component Value Date/Time   WBC 9.7 05/24/2019 1434   RBC 4.99 05/24/2019 1434   HGB 13.9 05/24/2019 1434   HCT 42.2 05/24/2019 1434   MCV 85 05/24/2019 1434   MCH 27.9 05/24/2019 1434    MCHC 32.9 05/24/2019 1434   RDW 13.3 05/24/2019 1434   LYMPHSABS 1.8 05/24/2019 1434   EOSABS 0.1 05/24/2019 1434   BASOSABS 0.0 05/24/2019 1434   Iron/TIBC/Ferritin/ %Sat No results found for: IRON, TIBC, FERRITIN, IRONPCTSAT Lipid Panel     Component Value Date/Time   CHOL 139 06/21/2019 1151   TRIG 70 06/21/2019 1151   HDL 37 (L) 06/21/2019 1151   Hepatic Function Panel     Component Value Date/Time   PROT 7.6 06/21/2019 1151   ALBUMIN 4.7 06/21/2019 1151   AST 17 06/21/2019 1151   ALT 21 06/21/2019 1151   ALKPHOS 73 06/21/2019 1151   BILITOT 0.3 06/21/2019 1151      Component Value Date/Time   TSH 3.020 06/21/2019 1151   TSH CANCELED 05/24/2019 1434      I, Burt KnackSharon Martin, am acting as Energy managertranscriptionist for Liberty Mutuallexandria Kadolph,  MD  I have reviewed the above documentation for accuracy and completeness, and I agree with the above. - Ilene Qua, MD

## 2019-07-12 ENCOUNTER — Ambulatory Visit (INDEPENDENT_AMBULATORY_CARE_PROVIDER_SITE_OTHER): Payer: No Typology Code available for payment source | Admitting: Psychology

## 2019-07-12 ENCOUNTER — Other Ambulatory Visit: Payer: Self-pay

## 2019-07-12 DIAGNOSIS — F3289 Other specified depressive episodes: Secondary | ICD-10-CM

## 2019-07-17 ENCOUNTER — Ambulatory Visit (INDEPENDENT_AMBULATORY_CARE_PROVIDER_SITE_OTHER): Payer: No Typology Code available for payment source | Admitting: Family Medicine

## 2019-07-18 ENCOUNTER — Other Ambulatory Visit: Payer: Self-pay

## 2019-07-18 ENCOUNTER — Ambulatory Visit (INDEPENDENT_AMBULATORY_CARE_PROVIDER_SITE_OTHER): Payer: No Typology Code available for payment source | Admitting: Family Medicine

## 2019-07-18 ENCOUNTER — Encounter (INDEPENDENT_AMBULATORY_CARE_PROVIDER_SITE_OTHER): Payer: Self-pay | Admitting: Family Medicine

## 2019-07-18 VITALS — BP 129/92 | HR 84 | Temp 97.9°F | Ht 62.0 in | Wt 207.0 lb

## 2019-07-18 DIAGNOSIS — E559 Vitamin D deficiency, unspecified: Secondary | ICD-10-CM | POA: Diagnosis not present

## 2019-07-18 DIAGNOSIS — E8881 Metabolic syndrome: Secondary | ICD-10-CM

## 2019-07-18 DIAGNOSIS — Z6838 Body mass index (BMI) 38.0-38.9, adult: Secondary | ICD-10-CM

## 2019-07-18 DIAGNOSIS — Z9189 Other specified personal risk factors, not elsewhere classified: Secondary | ICD-10-CM

## 2019-07-18 MED ORDER — VITAMIN D (ERGOCALCIFEROL) 1.25 MG (50000 UNIT) PO CAPS: 50000.0000 [IU] | ORAL_CAPSULE | ORAL | 0 refills | Status: DC

## 2019-07-18 NOTE — Progress Notes (Signed)
Office: [phone]  /  Fax: [phone]   HPI:   Chief Complaint: OBESITY Norma Cohen is here to discuss her progress with her obesity treatment plan. She is on the  follow the Category 4 plan and is following her eating plan approximately 95 % of the time. She states she is exercising 0 minutes 0 times per week. Norma Cohen had a great few weeks. She did have indulgence of a pumpkin spice latte but changing amount of pumpkin syrup. She started a new job at [employer] and will be working from 2-6. She is doing fig bars for snacks.  Her weight is 207 lb (93.9 kg) today and has had a weight loss of 3 pounds over a period of 3 weeks since her last visit. She has lost 11 lbs since starting treatment with Norma Cohen.  Vitamin D deficiency Norma Cohen has a diagnosis of vitamin D deficiency. She is currently taking vit D and denies nausea, vomiting or muscle weakness. She reports fatigue.   At risk for osteopenia and osteoporosis Norma Cohen is at higher risk of osteopenia and osteoporosis due to vitamin D deficiency.   Insulin Resistance Norma Cohen has a diagnosis of insulin resistance based on her elevated fasting insulin level of 31.7 and HgA1c of 5.2. Although Norma Cohen's blood glucose readings are still under good control, insulin resistance puts her at greater risk of metabolic syndrome and diabetes. She is not taking metformin currently and continues to work on diet and exercise to decrease risk of diabetes.  ASSESSMENT AND PLAN:  Vitamin D deficiency - Plan: Vitamin D, Ergocalciferol, (DRISDOL) 1.25 MG (50000 UT) CAPS capsule  Insulin resistance  At risk for osteoporosis  Class 2 severe obesity with serious comorbidity and body mass index (BMI) of 38.0 to 38.9 in adult, unspecified obesity type ([facility])  PLAN: Vitamin D Deficiency Norma Cohen was informed that low vitamin D levels contributes to fatigue and are associated with obesity, breast, and colon cancer. She agrees to continue to take prescription Vit D @50 ,000  IU every week #4 with no refills and will follow up for routine testing of vitamin D, at least 2-3 times per year. She was informed of the risk of over-replacement of vitamin D and agrees to not increase her dose unless she discusses this with Norma Cohen first.  At risk for osteopenia and osteoporosis Norma Cohen was given extended  (15 minutes) osteoporosis prevention counseling today. Norma Cohen is at risk for osteopenia and osteoporosis due to her vitamin D deficiency. She was encouraged to take her vitamin D and follow her higher calcium diet and increase strengthening exercise to help strengthen her bones and decrease her risk of osteopenia and osteoporosis.  Insulin Resistance Norma Cohen will continue to work on weight loss, exercise, and decreasing simple carbohydrates in her diet to help decrease the risk of diabetes. We dicussed metformin including benefits and risks. She was informed that eating too many simple carbohydrates or too many calories at one sitting increases the likelihood of GI side effects. Norma Cohen agreed to follow up with Norma Cohen as directed to monitor her progress.  Obesity Norma Cohen is currently in the action stage of change. As such, her goal is to continue with weight loss efforts She has agreed to follow the Category 4 plan Norma Cohen has been instructed to work up to a goal of 150 minutes of combined cardio and strengthening exercise per week for weight loss and overall health benefits. She will start body groove class 2 times a week.  We discussed the following Behavioral Modification Strategies  today:keeping healthy foods in the home, better snacking choices, planning for success, increasing lean protein intake, increasing vegetables and work on meal planning and easy cooking plans   Norma Cohen has agreed to follow up with our clinic in 2 weeks. She was informed of the importance of frequent follow up visits to maximize her success with intensive lifestyle modifications for her multiple health conditions.   ALLERGIES: Allergies  Allergen Reactions  . Gluten Meal   . Lactose Intolerance (Gi)   . Penicillins Rash    MEDICATIONS: Current Outpatient Medications on File Prior to Visit  Medication Sig Dispense Refill  . ibuprofen (ADVIL) 200 MG tablet Take 200 mg by mouth every 6 (six) hours as needed.    . loratadine (CLARITIN) 10 MG tablet Take 10 mg by mouth daily.     No current facility-administered medications on file prior to visit.     PAST MEDICAL HISTORY: Past Medical History:  Diagnosis Date  . ADHD (attention deficit hyperactivity disorder)   . Lactose intolerance     PAST SURGICAL HISTORY: History reviewed. No pertinent surgical history.  SOCIAL HISTORY: Social History   Tobacco Use  . Smoking status: Never Smoker  . Smokeless tobacco: Never Used  Substance Use Topics  . Alcohol use: Not on file  . Drug use: Not on file    FAMILY HISTORY: Family History  Problem Relation Age of Onset  . Diabetes Mother   . Anxiety disorder Mother   . Obesity Mother   . Diabetes Father   . Hypertension Father   . Cancer Father   . Obesity Father     ROS: Review of Systems  Constitutional: Positive for malaise/fatigue and weight loss.  Gastrointestinal: Negative for nausea and vomiting.  Musculoskeletal:       Negative for muscle weakness    PHYSICAL EXAM: Blood pressure (!) 129/92, pulse 84, temperature 97.9 F (36.6 C), temperature source Oral, height 5\' 2"  (1.575 m), weight 207 lb (93.9 kg), last menstrual period 07/03/2019, SpO2 97 %. Body mass index is 37.86 kg/m. Physical Exam Vitals signs reviewed.  Constitutional:      Appearance: Normal appearance. She is obese.  HENT:     Head: Normocephalic.     Nose: Nose normal.  Cardiovascular:     Rate and Rhythm: Normal rate.  Pulmonary:     Effort: Pulmonary effort is normal.  Musculoskeletal: Normal range of motion.  Skin:    General: Skin is warm and dry.  Neurological:     Mental Status: She is alert  and oriented to person, place, and time.  Psychiatric:        Mood and Affect: Mood normal.        Behavior: Behavior normal.     RECENT LABS AND TESTS: BMET    Component Value Date/Time   NA 141 06/21/2019 1151   K 4.2 06/21/2019 1151   CL 104 06/21/2019 1151   CO2 18 (L) 06/21/2019 1151   GLUCOSE 91 06/21/2019 1151   BUN 18 06/21/2019 1151   CREATININE 0.61 06/21/2019 1151   CALCIUM 9.5 06/21/2019 1151   GFRNONAA 126 06/21/2019 1151   GFRAA 146 06/21/2019 1151   Lab Results  Component Value Date   HGBA1C 5.2 06/21/2019   HGBA1C 5.3 05/24/2019   Lab Results  Component Value Date   INSULIN 31.7 (H) 06/21/2019   INSULIN CANCELED 05/24/2019   CBC    Component Value Date/Time   WBC 9.7 05/24/2019 1434   RBC 4.99 05/24/2019  1434   HGB 13.9 05/24/2019 1434   HCT 42.2 05/24/2019 1434   MCV 85 05/24/2019 1434   MCH 27.9 05/24/2019 1434   MCHC 32.9 05/24/2019 1434   RDW 13.3 05/24/2019 1434   LYMPHSABS 1.8 05/24/2019 1434   EOSABS 0.1 05/24/2019 1434   BASOSABS 0.0 05/24/2019 1434   Iron/TIBC/Ferritin/ %Sat No results found for: IRON, TIBC, FERRITIN, IRONPCTSAT Lipid Panel     Component Value Date/Time   CHOL 139 06/21/2019 1151   TRIG 70 06/21/2019 1151   HDL 37 (L) 06/21/2019 1151   LDLCALC 88 06/21/2019 1151   Hepatic Function Panel     Component Value Date/Time   PROT 7.6 06/21/2019 1151   ALBUMIN 4.7 06/21/2019 1151   AST 17 06/21/2019 1151   ALT 21 06/21/2019 1151   ALKPHOS 73 06/21/2019 1151   BILITOT 0.3 06/21/2019 1151      Component Value Date/Time   TSH 3.020 06/21/2019 1151   TSH CANCELED 05/24/2019 1434     Ref. Range 06/21/2019 11:51  Vitamin D, 25-Hydroxy Latest Ref Range: 30.0 - 100.0 ng/mL 27.9 (L)     OBESITY BEHAVIORAL INTERVENTION VISIT  Today's visit was # 5   Starting weight: 218 lbs Starting date: 11/23/18 Today's weight : Weight: 207 lb (93.9 kg)  Today's date: 07/18/2019 Total lbs lost to date: 11 At least 15 minutes  were spent on discussing the following behavioral intervention visit.   ASK: We discussed the diagnosis of obesity with Norma Cohen today and Norma Cohen agreed to give [place] permission to discuss obesity behavioral modification therapy today.  ASSESS: Norma Cohen has the diagnosis of obesity and her BMI today is 37.85 Norma Cohen is in the action stage of change   ADVISE: Norma Cohen was educated on the multiple health risks of obesity as well as the benefit of weight loss to improve her health. She was advised of the need for long term treatment and the importance of lifestyle modifications to improve her current health and to decrease her risk of future health problems.  AGREE: Multiple dietary modification options and treatment options were discussed and  Norma Cohen agreed to follow the recommendations documented in the above note.  ARRANGE: Norma Cohen was educated on the importance of frequent visits to treat obesity as outlined per CMS and USPSTF guidelines and agreed to schedule her next follow up appointment today.  I, Jeralene Peters, am acting as transcriptionist for Debbra Riding, MD  \ I have reviewed the above documentation for accuracy and completeness, and I agree with the above. - Debbra Riding, MD

## 2019-07-31 ENCOUNTER — Encounter (INDEPENDENT_AMBULATORY_CARE_PROVIDER_SITE_OTHER): Payer: Self-pay | Admitting: Family Medicine

## 2019-07-31 ENCOUNTER — Encounter (INDEPENDENT_AMBULATORY_CARE_PROVIDER_SITE_OTHER): Payer: Self-pay

## 2019-07-31 NOTE — Telephone Encounter (Signed)
FYI

## 2019-07-31 NOTE — Progress Notes (Signed)
Office: [phone]  /  Fax: [phone]    Date: August 09, 2019   Appointment Start Time: 8:31am Duration: 29 minutes Provider: Glennie Isle, Psy.D. Type of Session: Individual Therapy  Location of Patient: Home Location of Provider: Provider's Home Type of Contact: Telepsychological Visit via [employer] WebEx   Session Content: Norma Cohen is a 28 y.o. female presenting via [place] for a follow-up appointment to address the previously established treatment goal of decreasing emotional eating. Today's appointment was a telepsychological visit, as it is an option for appointments to reduce exposure to COVID-19. Norma Cohen expressed understanding regarding the rationale for telepsychological services, and provided verbal consent for today's appointment. Prior to proceeding with today's appointment, Norma Cohen's physical location at the time of this appointment was obtained. Norma Cohen reported she was at home and provided the address. In the event of technical difficulties, Norma Cohen shared a phone number she could be reached at. Norma Cohen and this provider participated in today's telepsychological service. Also, Norma Cohen denied anyone else being present in the room or on the WebEx appointment.  This provider conducted a brief check-in and verbally administered the PHQ-9 and GAD-7. Norma Cohen shared about her new job and noted, "I really like it." However, she shared she feels "frazzled" due to working every day and the adjustment. Regarding eating, Norma Cohen reported an overall reduction in emotional eating since the onset of treatment with this provider. Of note, Norma Cohen reported experiencing racing thoughts when trying to sleep resulting in difficulty falling asleep. Thus, it was recommended she keep a journal to write in at her bed side for a "brain dump." She was agreeable. This provider discussed termination planning. This provider recommended longer-term therapeutic services due to ongoing stressors and discussed options to  establish care with a new provider, including Jahna contacting her insurance company for a list of in-network providers, exploring [email or web address], or this provider placing a referral. [name] provided verbal consent for this provider to place a referral to address ongoing stressors. Frequency of future appointments with this provider will be reduced based on progress.  Moreover, Norma Cohen shared she has been engaging in mindfulness exercises and noted she also shared the five senses exercise with colleagues. Psychoeducation regarding formal (e.g., setting aside a specific time daily to engage in an exercise) and informal (e.g., cultivating awareness in the present moment and taking a non-judgmental approach while engaging in day-to-day tasks) mindfulness was provided. This provider also discussed the utilization of YouTube for mindfulness exercises (e.g., videos by Merri Ray). In addition, Norma Cohen was engaged in an mindfulness exercise focusing on her breath as an anchor. Her experience after was processed. Norma Cohen reported, "That really helped me" and she discussed how it can help "with ADD." Norma Cohen provided verbal consent during today's appointment for this provider to send the handout for today's exercise via e-mail. Overall, Norma Cohen was receptive to today's session as evidenced by openness to sharing, responsiveness to feedback, and willingness to continue engaging in mindfulness exercises.  Mental Status Examination:  Appearance: neat Behavior: cooperative Mood: euthymic Affect: mood congruent Speech: normal in rate, volume, and tone Eye Contact: appropriate Psychomotor Activity: appropriate Thought Process: linear, logical, and goal directed  Content/Perceptual Disturbances: no hallucinations, delusions, bizarre thinking or behavior reported or observed and no evidence of suicidal and homicidal ideation, plan, and intent Orientation: time, person, place and purpose of  appointment Cognition/Sensorium: memory, attention, language, and fund of knowledge intact  Insight: good Judgment: good  Structured Assessment Results: The Patient Health Questionnaire-9 (PHQ-9) is a self-report measure that  assesses symptoms and severity of depression over the course of the last two weeks. Norma Cohen obtained a score of 6 suggesting mild depression. Norma Cohen finds the endorsed symptoms to be somewhat difficult. Little interest or pleasure in doing things 0  Feeling down, depressed, or hopeless 0  Trouble falling or staying asleep, or sleeping too much 2  Feeling tired or having little energy 3  Poor appetite or overeating 0  Feeling bad about yourself --- or that you are a failure or have let yourself or your family down 0  Trouble concentrating on things, such as reading the newspaper or watching television 1  Moving or speaking so slowly that other people could have noticed? Or the opposite --- being so fidgety or restless that you have been moving around a lot more than usual 0  Thoughts that you would be better off dead or hurting yourself in some way 0  PHQ-9 Score 6    The Generalized Anxiety Disorder-7 (GAD-7) is a brief self-report measure that assesses symptoms of anxiety over the course of the last two weeks. Norma Cohen obtained a score of 6 suggesting mild anxiety. Norma Cohen finds the endorsed symptoms to be not difficult at all. Feeling nervous, anxious, on edge 0  Not being able to stop or control worrying 0  Worrying too much about different things 0  Trouble relaxing 3  Being so restless that it's hard to sit still 3  Becoming easily annoyed or irritable 0  Feeling afraid as if something awful might happen 0  GAD-7 Score 6   Interventions:  Administered PHQ-9 and GAD-7 for symptom monitoring Verbal administration of PHQ-9 and GAD-7 for symptom monitoring Provided empathic reflections and validation Psychoeducation provided regarding mindfulness Engaged patient in a  mindfulness exercise Discussed termination planning Discussed option for a referral for longer-term therapeutic services Employed supportive psychotherapy interventions to facilitate reduced distress, and to improve coping skills with identified stressors Employed acceptance and commitment interventions to emphasize mindfulness and acceptance without struggle  DSM-5 Diagnosis: 311 (F32.8) Other Specified Depressive Disorder, Emotional Eating Behaviors  Treatment Goal & Progress: During the initial appointment with this provider, the following treatment goal was established: decrease emotional eating. Shelena has demonstrated progress in her goal as evidenced by increased awareness of hunger patterns and triggers for emotional eating. Annibelle also reported a reduction in emotional eating and demonstrates willingness to engage in mindfulness exercises.  Plan: Marti continues to appear able and willing to participate as evidenced by engagement in reciprocal conversation, and asking questions for clarification as appropriate. The next appointment will be scheduled in three weeks, which will be via [employer]. The next session will focus further on mindfulness.

## 2019-08-02 ENCOUNTER — Ambulatory Visit (INDEPENDENT_AMBULATORY_CARE_PROVIDER_SITE_OTHER): Payer: PRIVATE HEALTH INSURANCE | Admitting: Family Medicine

## 2019-08-02 ENCOUNTER — Other Ambulatory Visit: Payer: Self-pay

## 2019-08-02 ENCOUNTER — Encounter (INDEPENDENT_AMBULATORY_CARE_PROVIDER_SITE_OTHER): Payer: Self-pay | Admitting: Family Medicine

## 2019-08-02 VITALS — BP 116/83 | HR 81 | Temp 98.0°F | Ht 62.0 in | Wt 206.0 lb

## 2019-08-02 DIAGNOSIS — E559 Vitamin D deficiency, unspecified: Secondary | ICD-10-CM | POA: Diagnosis not present

## 2019-08-02 DIAGNOSIS — Z6837 Body mass index (BMI) 37.0-37.9, adult: Secondary | ICD-10-CM | POA: Diagnosis not present

## 2019-08-02 DIAGNOSIS — E8881 Metabolic syndrome: Secondary | ICD-10-CM

## 2019-08-03 NOTE — Progress Notes (Signed)
Office: [phone]  /  Fax: [phone]   HPI:   Chief Complaint: OBESITY Norma Cohen is here to discuss her progress with her obesity treatment plan. She is on the Category 4 plan and is following her eating plan approximately 85 % of the time. She states she is doing body [place] for 30 minutes 1-2 times per week. Norma Cohen is slowly adjusting to working 7 days out of the week. She is occasionally skipping meals due to increase in focus. she is occasionally substituting microwave meals for dinner secondary to convenience.  Her weight is 206 lb (93.4 kg) today and has had a weight loss of 1 pound over a period of 2 weeks since her last visit. She has lost 12 lbs since starting treatment with us.  Insulin Resistance Norma Cohen has a diagnosis of insulin resistance based on her elevated fasting insulin level >5. Although Norma Cohen's blood glucose readings are still under good control, insulin resistance puts her at greater risk of metabolic syndrome and diabetes. She is not taking metformin currently and notes occasional carbohydrate cravings. She continues to work on diet and exercise to decrease risk of diabetes.  Vitamin D Deficiency Norma Cohen has a diagnosis of vitamin D deficiency. She is currently taking prescription Vit D. She notes fatigue and denies nausea, vomiting or muscle weakness.  ASSESSMENT AND PLAN:  Insulin resistance  Vitamin D deficiency  Class 2 severe obesity with serious comorbidity and body mass index (BMI) of 37.0 to 37.9 in adult, unspecified obesity type (HCC)  PLAN:  Insulin Resistance Darlean will continue to work on weight loss, exercise, and decreasing simple carbohydrates in her diet to help decrease the risk of diabetes. We dicussed metformin including benefits and risks. She was informed that eating too many simple carbohydrates or too many calories at one sitting increases the likelihood of GI side effects. We will repeat labs in mid December. Fraya agrees to follow up with  us as directed to monitor her progress.  Vitamin D Deficiency Norma Cohen was informed that low vitamin D levels contributes to fatigue and are associated with obesity, breast, and colon cancer. Norma Cohen agrees to continue taking prescription Vit D 50,000 IU every week, no refill needed. She will follow up for routine testing of vitamin D, at least 2-3 times per year. She was informed of the risk of over-replacement of vitamin D and agrees to not increase her dose unless she discusses this with us first. Norma Cohen agrees to follow up with our clinic in 2 weeks.  I spent > than 50% of the 15 minute visit on counseling as documented in the note.  Obesity Norma Cohen is currently in the action stage of change. As such, her goal is to continue with weight loss efforts She has agreed to follow the Category 4 plan Norma Cohen has been instructed to work up to a goal of 150 minutes of combined cardio and strengthening exercise per week for weight loss and overall health benefits. We discussed the following Behavioral Modification Strategies today: increasing lean protein intake, increasing vegetables and work on meal planning and easy cooking plans, keeping healthy foods in the home, and planning for success   Norma Cohen has agreed to follow up with our clinic in 2 weeks. She was informed of the importance of frequent follow up visits to maximize her success with intensive lifestyle modifications for her multiple health conditions.  ALLERGIES: Allergies  Allergen Reactions  . Gluten Meal   . Lactose Intolerance (Gi)   . Penicillins Rash  MEDICATIONS: Current Outpatient Medications on File Prior to Visit  Medication Sig Dispense Refill  . ibuprofen (ADVIL) 200 MG tablet Take 200 mg by mouth every 6 (six) hours as needed.    . loratadine (CLARITIN) 10 MG tablet Take 10 mg by mouth daily.    . Vitamin D, Ergocalciferol, (DRISDOL) 1.25 MG (50000 UT) CAPS capsule Take 1 capsule (50,000 Units total) by mouth every 7 (seven)  days. 4 capsule 0   No current facility-administered medications on file prior to visit.     PAST MEDICAL HISTORY: Past Medical History:  Diagnosis Date  . ADHD (attention deficit hyperactivity disorder)   . Lactose intolerance     PAST SURGICAL HISTORY: History reviewed. No pertinent surgical history.  SOCIAL HISTORY: Social History   Tobacco Use  . Smoking status: Never Smoker  . Smokeless tobacco: Never Used  Substance Use Topics  . Alcohol use: Not on file  . Drug use: Not on file    FAMILY HISTORY: Family History  Problem Relation Age of Onset  . Diabetes Mother   . Anxiety disorder Mother   . Obesity Mother   . Diabetes Father   . Hypertension Father   . Cancer Father   . Obesity Father     ROS: Review of Systems  Constitutional: Positive for malaise/fatigue and weight loss.  Gastrointestinal: Negative for nausea and vomiting.  Musculoskeletal:       Negative muscle weakness    PHYSICAL EXAM: Blood pressure 116/83, pulse 81, temperature 98 F (36.7 C), temperature source Oral, height 5\' 2"  (1.575 m), weight 206 lb (93.4 kg), last menstrual period 07/03/2019, SpO2 99 %. Body mass index is 37.68 kg/m. Physical Exam Vitals signs reviewed.  Constitutional:      Appearance: Normal appearance. She is obese.  Cardiovascular:     Rate and Rhythm: Normal rate.     Pulses: Normal pulses.  Pulmonary:     Effort: Pulmonary effort is normal.     Breath sounds: Normal breath sounds.  Musculoskeletal: Normal range of motion.  Skin:    General: Skin is warm and dry.  Neurological:     Mental Status: She is alert and oriented to person, place, and time.  Psychiatric:        Mood and Affect: Mood normal.        Behavior: Behavior normal.     RECENT LABS AND TESTS: BMET    Component Value Date/Time   NA 141 06/21/2019 1151   K 4.2 06/21/2019 1151   CL 104 06/21/2019 1151   CO2 18 (L) 06/21/2019 1151   GLUCOSE 91 06/21/2019 1151   BUN 18 06/21/2019  1151   CREATININE 0.61 06/21/2019 1151   CALCIUM 9.5 06/21/2019 1151   GFRNONAA 126 06/21/2019 1151   GFRAA 146 06/21/2019 1151   Lab Results  Component Value Date   HGBA1C 5.2 06/21/2019   HGBA1C 5.3 05/24/2019   Lab Results  Component Value Date   INSULIN 31.7 (H) 06/21/2019   INSULIN CANCELED 05/24/2019   CBC    Component Value Date/Time   WBC 9.7 05/24/2019 1434   RBC 4.99 05/24/2019 1434   HGB 13.9 05/24/2019 1434   HCT 42.2 05/24/2019 1434   MCV 85 05/24/2019 1434   MCH 27.9 05/24/2019 1434   MCHC 32.9 05/24/2019 1434   RDW 13.3 05/24/2019 1434   LYMPHSABS 1.8 05/24/2019 1434   EOSABS 0.1 05/24/2019 1434   BASOSABS 0.0 05/24/2019 1434   Iron/TIBC/Ferritin/ %Sat No results found for:  IRON, TIBC, FERRITIN, IRONPCTSAT Lipid Panel     Component Value Date/Time   CHOL 139 06/21/2019 1151   TRIG 70 06/21/2019 1151   HDL 37 (L) 06/21/2019 1151   LDLCALC 88 06/21/2019 1151   Hepatic Function Panel     Component Value Date/Time   PROT 7.6 06/21/2019 1151   ALBUMIN 4.7 06/21/2019 1151   AST 17 06/21/2019 1151   ALT 21 06/21/2019 1151   ALKPHOS 73 06/21/2019 1151   BILITOT 0.3 06/21/2019 1151      Component Value Date/Time   TSH 3.020 06/21/2019 1151   TSH CANCELED 05/24/2019 1434      OBESITY BEHAVIORAL INTERVENTION VISIT  Today's visit was # 6   Starting weight: 218 lbs Starting date: 05/24/2019 Today's weight : 206 lbs Today's date: 08/02/2019 Total lbs lost to date: 12    ASK: We discussed the diagnosis of obesity with Junie Spencer today and Norma Glade agreed to give [place] permission to discuss obesity behavioral modification therapy today.  ASSESS: Judianne has the diagnosis of obesity and her BMI today is 37.67 Amaia is in the action stage of change   ADVISE: Shyia was educated on the multiple health risks of obesity as well as the benefit of weight loss to improve her health. She was advised of the need for long term treatment and the  importance of lifestyle modifications to improve her current health and to decrease her risk of future health problems.  AGREE: Multiple dietary modification options and treatment options were discussed and  Joshua agreed to follow the recommendations documented in the above note.  ARRANGE: Tiwanna was educated on the importance of frequent visits to treat obesity as outlined per CMS and USPSTF guidelines and agreed to schedule her next follow up appointment today.  I, Burt Knack, am acting as transcriptionist for Debbra Riding, MD  I have reviewed the above documentation for accuracy and completeness, and I agree with the above. - Debbra Riding, MD

## 2019-08-08 ENCOUNTER — Encounter (INDEPENDENT_AMBULATORY_CARE_PROVIDER_SITE_OTHER): Payer: Self-pay

## 2019-08-09 ENCOUNTER — Other Ambulatory Visit: Payer: Self-pay

## 2019-08-09 ENCOUNTER — Ambulatory Visit (INDEPENDENT_AMBULATORY_CARE_PROVIDER_SITE_OTHER): Payer: PRIVATE HEALTH INSURANCE | Admitting: Psychology

## 2019-08-09 DIAGNOSIS — F3289 Other specified depressive episodes: Secondary | ICD-10-CM | POA: Diagnosis not present

## 2019-08-21 ENCOUNTER — Ambulatory Visit (INDEPENDENT_AMBULATORY_CARE_PROVIDER_SITE_OTHER): Payer: PRIVATE HEALTH INSURANCE | Admitting: Physician Assistant

## 2019-08-21 ENCOUNTER — Encounter (INDEPENDENT_AMBULATORY_CARE_PROVIDER_SITE_OTHER): Payer: Self-pay | Admitting: Physician Assistant

## 2019-08-21 ENCOUNTER — Other Ambulatory Visit: Payer: Self-pay

## 2019-08-21 VITALS — BP 126/82 | HR 79 | Temp 98.3°F | Ht 62.0 in | Wt 207.0 lb

## 2019-08-21 DIAGNOSIS — E7849 Other hyperlipidemia: Secondary | ICD-10-CM

## 2019-08-21 DIAGNOSIS — Z9189 Other specified personal risk factors, not elsewhere classified: Secondary | ICD-10-CM | POA: Diagnosis not present

## 2019-08-21 DIAGNOSIS — Z6837 Body mass index (BMI) 37.0-37.9, adult: Secondary | ICD-10-CM

## 2019-08-21 DIAGNOSIS — E559 Vitamin D deficiency, unspecified: Secondary | ICD-10-CM | POA: Diagnosis not present

## 2019-08-21 MED ORDER — VITAMIN D (ERGOCALCIFEROL) 1.25 MG (50000 UNIT) PO CAPS: 50000.0000 [IU] | ORAL_CAPSULE | ORAL | 0 refills | Status: DC

## 2019-08-22 NOTE — Progress Notes (Signed)
Office: [phone]  /  Fax: [phone]   HPI:   Chief Complaint: OBESITY Norma Cohen is here to discuss her progress with her obesity treatment plan. She is on the Category 4 plan and is following her eating plan approximately 45 % of the time. She states she is doing cardio exercise 30 minutes 3 times per week. Norma Cohen reports that she has been stressed with her work schedule, and helping with a house building project. She is not eating all of her protein at dinner. Her weight is 207 lb (93.9 kg) today and has had a weight gain of 1 pound over a period of 2 to 3 weeks since her last visit. She has lost 11 lbs since starting treatment with [place].  Vitamin D deficiency Norma Cohen has a diagnosis of vitamin D deficiency. Norma Cohen is currently taking vit D and she denies nausea, vomiting or muscle weakness.  At risk for osteopenia and osteoporosis Norma Cohen is at higher risk of osteopenia and osteoporosis due to vitamin D deficiency.   Hyperlipidemia Norma Cohen has hyperlipidemia and she is not on medication. She has been trying to improve her cholesterol levels with intensive lifestyle modification including a low saturated fat diet, exercise and weight loss. She denies any chest pain.  ASSESSMENT AND PLAN:  Vitamin D deficiency - Plan: Vitamin D, Ergocalciferol, (DRISDOL) 1.25 MG (50000 UT) CAPS capsule  Other hyperlipidemia  At risk for osteoporosis  Class 2 severe obesity with serious comorbidity and body mass index (BMI) of 37.0 to 37.9 in adult, unspecified obesity type (HCC)  PLAN:  Vitamin D Deficiency Norma Cohen was informed that low vitamin D levels contributes to fatigue and are associated with obesity, breast, and colon cancer. Norma Cohen agrees to continue to take prescription Vit D @50 ,000 IU every week #4 with no refills and she will follow up for routine testing of vitamin D, at least 2-3 times per year. She was informed of the risk of over-replacement of vitamin D and agrees to not increase her dose  unless she discusses this with first. Norma Cohen agrees to follow up with our clinic in 2 weeks.  At risk for osteopenia and osteoporosis Norma Cohen was given extended  (15 minutes) osteoporosis prevention counseling today. Norma Cohen is at risk for osteopenia and osteoporosis due to her vitamin D deficiency. She was encouraged to take her vitamin D and follow her higher calcium diet and increase strengthening exercise to help strengthen her bones and decrease her risk of osteopenia and osteoporosis.  Hyperlipidemia Norma Cohen was informed of the American Heart Association Guidelines emphasizing intensive lifestyle modifications as the first line treatment for hyperlipidemia. We discussed many lifestyle modifications today in depth, and Norma Cohen will continue to work on decreasing saturated fats such as fatty red meat, butter and many fried foods. She will also increase vegetables and lean protein in her diet and continue to work on exercise and weight loss efforts.  Obesity Norma Cohen is currently in the action stage of change. As such, her goal is to continue with weight loss efforts She has agreed to keep a food journal with 550 to 700 calories and 45 grams of protein at supper daily and follow the Category 4 plan Norma Cohen has been instructed to work up to a goal of 150 minutes of combined cardio and strengthening exercise per week for weight loss and overall health benefits. We discussed the following Behavioral Modification Strategies today: no skipping meals and increasing lean protein intake  Norma Cohen has agreed to follow up with our clinic in  2 weeks. She was informed of the importance of frequent follow up visits to maximize her success with intensive lifestyle modifications for her multiple health conditions.  ALLERGIES: Allergies  Allergen Reactions   Gluten Meal    Lactose Intolerance (Gi)    Penicillins Rash    MEDICATIONS: Current Outpatient Medications on File Prior to Visit  Medication Sig Dispense  Refill   ibuprofen (ADVIL) 200 MG tablet Take 200 mg by mouth every 6 (six) hours as needed.     loratadine (CLARITIN) 10 MG tablet Take 10 mg by mouth daily.     No current facility-administered medications on file prior to visit.     PAST MEDICAL HISTORY: Past Medical History:  Diagnosis Date   ADHD (attention deficit hyperactivity disorder)    Lactose intolerance     PAST SURGICAL HISTORY: History reviewed. No pertinent surgical history.  SOCIAL HISTORY: Social History   Tobacco Use   Smoking status: Never Smoker   Smokeless tobacco: Never Used  Substance Use Topics   Alcohol use: Not on file   Drug use: Not on file    FAMILY HISTORY: Family History  Problem Relation Age of Onset   Diabetes Mother    Anxiety disorder Mother    Obesity Mother    Diabetes Father    Hypertension Father    Cancer Father    Obesity Father     ROS: Review of Systems  Constitutional: Negative for weight loss.  Cardiovascular: Negative for chest pain.  Gastrointestinal: Negative for nausea and vomiting.  Musculoskeletal:       Negative for muscle weakness    PHYSICAL EXAM: Blood pressure 126/82, pulse 79, temperature 98.3 F (36.8 C), temperature source Oral, height 5\' 2"  (1.575 m), weight 207 lb (93.9 kg), SpO2 99 %. Body mass index is 37.86 kg/m. Physical Exam Vitals signs reviewed.  Constitutional:      Appearance: Normal appearance. She is well-developed. She is obese.  Cardiovascular:     Rate and Rhythm: Normal rate.  Pulmonary:     Effort: Pulmonary effort is normal.  Musculoskeletal: Normal range of motion.  Skin:    General: Skin is warm and dry.  Neurological:     Mental Status: She is alert and oriented to person, place, and time.  Psychiatric:        Mood and Affect: Mood normal.        Behavior: Behavior normal.     RECENT LABS AND TESTS: BMET    Component Value Date/Time   NA 141 06/21/2019 1151   K 4.2 06/21/2019 1151   CL 104  06/21/2019 1151   CO2 18 (L) 06/21/2019 1151   GLUCOSE 91 06/21/2019 1151   BUN 18 06/21/2019 1151   CREATININE 0.61 06/21/2019 1151   CALCIUM 9.5 06/21/2019 1151   GFRNONAA 126 06/21/2019 1151   GFRAA 146 06/21/2019 1151   Lab Results  Component Value Date   HGBA1C 5.2 06/21/2019   HGBA1C 5.3 05/24/2019   Lab Results  Component Value Date   INSULIN 31.7 (H) 06/21/2019   INSULIN CANCELED 05/24/2019   CBC    Component Value Date/Time   WBC 9.7 05/24/2019 1434   RBC 4.99 05/24/2019 1434   HGB 13.9 05/24/2019 1434   HCT 42.2 05/24/2019 1434   MCV 85 05/24/2019 1434   MCH 27.9 05/24/2019 1434   MCHC 32.9 05/24/2019 1434   RDW 13.3 05/24/2019 1434   LYMPHSABS 1.8 05/24/2019 1434   EOSABS 0.1 05/24/2019 1434   BASOSABS  0.0 05/24/2019 1434   Iron/TIBC/Ferritin/ %Sat No results found for: IRON, TIBC, FERRITIN, IRONPCTSAT Lipid Panel     Component Value Date/Time   CHOL 139 06/21/2019 1151   TRIG 70 06/21/2019 1151   HDL 37 (L) 06/21/2019 1151   LDLCALC 88 06/21/2019 1151   Hepatic Function Panel     Component Value Date/Time   PROT 7.6 06/21/2019 1151   ALBUMIN 4.7 06/21/2019 1151   AST 17 06/21/2019 1151   ALT 21 06/21/2019 1151   ALKPHOS 73 06/21/2019 1151   BILITOT 0.3 06/21/2019 1151      Component Value Date/Time   TSH 3.020 06/21/2019 1151   TSH 3.02 06/21/2019   TSH CANCELED 05/24/2019 1434     Ref. Range 06/21/2019 11:51  Vitamin D, 25-Hydroxy Latest Ref Range: 30.0 - 100.0 ng/mL 27.9 (L)    OBESITY BEHAVIORAL INTERVENTION VISIT  Today's visit was # 7   Starting weight: 218 lbs Starting date: 05/24/2019 Today's weight : 207 lbs Today's date: 08/21/2019 Total lbs lost to date: 11    08/21/2019  Height 5\' 2"  (1.575 m)  Weight 207 lb (93.9 kg)  BMI (Calculated) 37.85  BLOOD PRESSURE - SYSTOLIC 502  BLOOD PRESSURE - DIASTOLIC 82   Body Fat % 77.4 %  Total Body Water (lbs) 83.4 lbs    ASK: We discussed the diagnosis of obesity with Norma Cohen today and Norma Cohen agreed to give [place] permission to discuss obesity behavioral modification therapy today.  ASSESS: Norma Cohen has the diagnosis of obesity and her BMI today is 37.85 Norma Cohen is in the action stage of change   ADVISE: Norma Cohen was educated on the multiple health risks of obesity as well as the benefit of weight loss to improve her health. She was advised of the need for long term treatment and the importance of lifestyle modifications to improve her current health and to decrease her risk of future health problems.  AGREE: Multiple dietary modification options and treatment options were discussed and  Norma Cohen agreed to follow the recommendations documented in the above note.  ARRANGE: Norma Cohen was educated on the importance of frequent visits to treat obesity as outlined per CMS and USPSTF guidelines and agreed to schedule her next follow up appointment today.  Corey Skains, am acting as transcriptionist for Abby Potash, PA-C I, Abby Potash, PA-C have reviewed above note and agree with its content

## 2019-08-23 NOTE — Progress Notes (Unsigned)
Office: [phone]  /  Fax: [phone]    Date: August 30, 2019   Appointment Start Time: *** Duration: *** minutes Provider: Glennie Isle, Psy.D. Type of Session: Individual Therapy  Location of Patient: {gbptloc:23249} Location of Provider: {Location of Service:22491} Type of Contact: Telepsychological Visit via [employer] Web[employer] ***  Session Content: Norma Cohen is a 28 y.o. female presenting via [place] for a follow-up appointment to address the previously established treatment goal of decreasing emotional eating. Today's appointment was a telepsychological visit, as it is an option for appointments to reduce exposure to COVID-19. Norma Cohen expressed understanding regarding the rationale for telepsychological services, and provided verbal consent for today's appointment. Prior to proceeding with today's appointment, Norma Cohen's physical location at the time of this appointment was obtained. In the event of technical difficulties, Norma Cohen shared a phone number she could be reached at. Norma Cohen and this provider participated in today's telepsychological service. Also, Norma Cohen denied anyone else being present in the room or on the WebEx appointment ***.  This provider conducted a brief check-in and verbally administered the PHQ-9 and GAD-7. *** Norma Cohen was receptive to today's session as evidenced by openness to sharing, responsiveness to feedback, and ***.  Mental Status Examination:  Appearance: {Appearance:22431} Behavior: {Behavior:22445} Mood: {gbmood:21757} Affect: {Affect:22436} Speech: {Speech:22432} Eye Contact: {Eye Contact:22433} Psychomotor Activity: {Motor Activity:22434} Thought Process: {thought process:22448}  Content/Perceptual Disturbances: {disturbances:22451} Orientation: {Orientation:22437} Cognition/Sensorium: {gbcognition:22449} Insight: {Insight:22446} Judgment: {Insight:22446}  Structured Assessment Results: The Patient Health Questionnaire-9 (PHQ-9) is a self-report measure  that assesses symptoms and severity of depression over the course of the last two weeks. Norma Cohen obtained a score of *** suggesting {GBPHQ9SEVERITY:21752}. Norma Cohen finds the endorsed symptoms to be {gbphq9difficulty:21754}. Norma Cohen interest or pleasure in doing things ***  Feeling down, depressed, or hopeless ***  Trouble falling or staying asleep, or sleeping too much ***  Feeling tired or having Norma Cohen energy ***  Poor appetite or overeating ***  Feeling bad about yourself --- or that you are a failure or have let yourself or your family down ***  Trouble concentrating on things, such as reading the newspaper or watching television ***  Moving or speaking so slowly that other people could have noticed? Or the opposite --- being so fidgety or restless that you have been moving around a lot more than usual ***  Thoughts that you would be better off dead or hurting yourself in some way ***  PHQ-9 Score ***    The Generalized Anxiety Disorder-7 (GAD-7) is a brief self-report measure that assesses symptoms of anxiety over the course of the last two weeks. Norma Cohen obtained a score of *** suggesting {gbgad7severity:21753}. Norma Cohen finds the endorsed symptoms to be {gbphq9difficulty:21754}. Feeling nervous, anxious, on edge ***  Not being able to stop or control worrying ***  Worrying too much about different things ***  Trouble relaxing ***  Being so restless that it's hard to sit still ***  Becoming easily annoyed or irritable ***  Feeling afraid as if something awful might happen ***  GAD-7 Score ***   Interventions:  {Interventions:22172}  DSM-5 Diagnosis: 311 (F32.8) Other Specified Depressive Disorder, Emotional Eating Behaviors  Treatment Goal & Progress: During the initial appointment with this provider, the following treatment goal was established: decrease emotional eating. Harlie has demonstrated progress in her goal as evidenced by {gbtxprogress:22839}. Ladiamond also reported  {gbtxprogress2:22951}.  Plan: Kerryann continues to appear able and willing to participate as evidenced by engagement in reciprocal conversation, and asking questions for clarification as appropriate. The next appointment  will be scheduled in {gbweeks:21758}, which will be via [employer]. The next session will focus on reviewing learned skills, and working towards the established treatment goal.***

## 2019-08-30 ENCOUNTER — Ambulatory Visit (INDEPENDENT_AMBULATORY_CARE_PROVIDER_SITE_OTHER): Payer: Self-pay | Admitting: Psychology

## 2019-09-06 ENCOUNTER — Other Ambulatory Visit: Payer: Self-pay | Admitting: Physician Assistant

## 2019-09-06 ENCOUNTER — Other Ambulatory Visit (HOSPITAL_COMMUNITY)
Admission: RE | Admit: 2019-09-06 | Discharge: 2019-09-06 | Disposition: A | Discharge status: Home / Self Care | Payer: PRIVATE HEALTH INSURANCE | Source: Ambulatory Visit | Attending: Physician Assistant | Admitting: Physician Assistant

## 2019-09-06 DIAGNOSIS — Z0000 Encounter for general adult medical examination without abnormal findings: Secondary | ICD-10-CM | POA: Diagnosis present

## 2019-09-06 NOTE — Progress Notes (Signed)
Office: [phone]  /  Fax: [phone]    Date: September 18, 2019   Appointment Start Time: 9:30am Duration: 29 minutes Provider: Glennie Isle, Psy.D. Type of Session: Individual Therapy  Location of Patient: Home Location of Provider: [facility] Type of Contact: Telepsychological Visit via [employer] WebEx   Session Content: Norma Cohen is a 28 y.o. female presenting via [name] for a follow-up appointment to address the previously established treatment goal of decreasing emotional eating. Today's appointment was a telepsychological visit, as it is an option for appointments to reduce exposure to COVID-19. Norma Cohen expressed understanding regarding the rationale for telepsychological services, and provided verbal consent for today's appointment. Prior to proceeding with today's appointment, Norma Cohen's physical location at the time of this appointment was obtained. In the event of technical difficulties, Norma Cohen shared a phone number she could be reached at. Norma Cohen and this provider participated in today's telepsychological service. Also, Norma Cohen denied anyone else being present in the room or on the WebEx appointment .  This provider conducted a brief check-in and verbally administered the PHQ-9 and GAD-7. Norma Cohen stated she initiated therapeutic services with [facility]. She noted their next appointment is on October 02, 2019. Axel stated she put in her notice at her second job. Regarding eating, Norma Cohen noted, "It's been good," but described Thanksgiving as "triggering." Nevertheless, Norma Cohen reported she was able to "get back on track." Since the last appointment with this provider, Norma Cohen indicated she could not recall any episodes of emotional eating. Remainder of today's appointment focused on mindfulness. Norma Cohen reported she engaged in it occasionally, but explained she has been busy. The rationale for engaging in mindfulness regularly was reviewed. This provider and  Norma Cohen discussed options for when she can regularly engage in a formal mindfulness exercise. She noted a plan to engage in an exercise in the mornings around her coffee time. Meline was also led through a mindfulness exercise, "A Taste of Mindfulness." She indicated, "That was nice" and described she typically has "a lot of wandering thoughts." Shantaya provided verbal consent during today's appointment for this provider to send the handout with today's exercise via e-mail. Overall, Norma Cohen was receptive to today's session as evidenced by openness to sharing, responsiveness to feedback, and willingness to continue engaging in mindfulness exercises.  Mental Status Examination:  Appearance: neat Behavior: cooperative Mood: euthymic Affect: mood congruent Speech: normal in rate, volume, and tone Eye Contact: appropriate Psychomotor Activity: appropriate Thought Process: linear, logical, and goal directed  Content/Perceptual Disturbances: no hallucinations, delusions, bizarre thinking or behavior reported or observed and no evidence of suicidal and homicidal ideation, plan, and intent Orientation: time, person, place and purpose of appointment Cognition/Sensorium: memory, attention, language, and fund of knowledge intact  Insight: good Judgment: good  Structured Assessment Results: The Patient Health Questionnaire-9 (PHQ-9) is a self-report measure that assesses symptoms and severity of depression over the course of the last two weeks. Norma Cohen obtained a score of 2 suggesting minimal depression. Norma Cohen finds the endorsed symptoms to be not difficult at all. Little interest or pleasure in doing things 0  Feeling down, depressed, or hopeless 0  Trouble falling or staying asleep, or sleeping too much 1  Feeling tired or having little energy 1  Poor appetite or overeating 0  Feeling bad about yourself --- or that you are a failure or have let yourself or your family down 0  Trouble concentrating on things,  such as reading the newspaper or watching television 0  Moving or  speaking so slowly that other people could have noticed? Or the opposite --- being so fidgety or restless that you have been moving around a lot more than usual 0  Thoughts that you would be better off dead or hurting yourself in some way 0  PHQ-9 Score 2    The Generalized Anxiety Disorder-7 (GAD-7) is a brief self-report measure that assesses symptoms of anxiety over the course of the last two weeks. Norma Cohen obtained a score of 3 suggesting minimal anxiety. Norma Cohen finds the endorsed symptoms to be not difficult at all. Feeling nervous, anxious, on edge 1  Not being able to stop or control worrying 0  Worrying too much about different things 0  Trouble relaxing 1  Being so restless that it's hard to sit still 0  Becoming easily annoyed or irritable 1  Feeling afraid as if something awful might happen 0  GAD-7 Score 3   Interventions:  Conducted a brief chart review Verbal administration of PHQ-9 and GAD-7 for symptom monitoring Provided empathic reflections and validation Reviewed content from the previous session Psychoeducation provided regarding mindfulness Engaged patient in a mindfulness exercise Employed motivational interviewing skills to assess patient's willingness/desire to adhere to recommended medical treatments and assignments Employed supportive psychotherapy interventions to facilitate reduced distress, and to improve coping skills with identified stressors Employed acceptance and commitment interventions to emphasize mindfulness and acceptance without struggle  DSM-5 Diagnosis: 311 (F32.8) Other Specified Depressive Disorder, Emotional Eating Behaviors  Treatment Goal & Progress: During the initial appointment with this provider, the following treatment goal was established: decrease emotional eating. Norma Cohen has demonstrated progress in her goal as evidenced by increased awareness of hunger patterns and  triggers for emotional eating. Norma Cohen also reported a reduction in emotional eating and continues to demonstrate willingness to engage in learned skill(s).  Plan: Norma Cohen noted a plan to continue services with her new provider at [facility]. She acknowledged understanding that she may request a follow-up appointment with this provider in the future as long as she is still established with the clinic. No further follow-up planned by this provider.

## 2019-09-10 ENCOUNTER — Other Ambulatory Visit (INDEPENDENT_AMBULATORY_CARE_PROVIDER_SITE_OTHER): Payer: Self-pay | Admitting: Physician Assistant

## 2019-09-10 DIAGNOSIS — E559 Vitamin D deficiency, unspecified: Secondary | ICD-10-CM

## 2019-09-11 ENCOUNTER — Ambulatory Visit (INDEPENDENT_AMBULATORY_CARE_PROVIDER_SITE_OTHER): Payer: PRIVATE HEALTH INSURANCE | Admitting: Physician Assistant

## 2019-09-11 ENCOUNTER — Other Ambulatory Visit: Payer: Self-pay

## 2019-09-11 ENCOUNTER — Encounter (INDEPENDENT_AMBULATORY_CARE_PROVIDER_SITE_OTHER): Payer: Self-pay | Admitting: Physician Assistant

## 2019-09-11 VITALS — BP 122/75 | HR 72 | Temp 98.1°F | Ht 62.0 in | Wt 203.0 lb

## 2019-09-11 DIAGNOSIS — E559 Vitamin D deficiency, unspecified: Secondary | ICD-10-CM

## 2019-09-11 DIAGNOSIS — E7849 Other hyperlipidemia: Secondary | ICD-10-CM

## 2019-09-11 DIAGNOSIS — Z9189 Other specified personal risk factors, not elsewhere classified: Secondary | ICD-10-CM

## 2019-09-11 DIAGNOSIS — Z6837 Body mass index (BMI) 37.0-37.9, adult: Secondary | ICD-10-CM

## 2019-09-11 LAB — CYTOLOGY - PAP
Diagnosis: NEGATIVE
Diagnosis: REACTIVE

## 2019-09-11 MED ORDER — VITAMIN D (ERGOCALCIFEROL) 1.25 MG (50000 UNIT) PO CAPS: 50000.0000 [IU] | ORAL_CAPSULE | ORAL | 0 refills | Status: DC

## 2019-09-12 NOTE — Progress Notes (Signed)
Office: [phone]  /  Fax: [phone]   HPI:   Chief Complaint: OBESITY Norma Cohen is here to discuss her progress with her obesity treatment plan. She is on the keep a food journal with 550 to 700 calories and 45 grams of protein at supper daily and the Category 4 plan and is following her eating plan approximately 80 % of the time. She states she is doing Body Groove 29 to 30 minutes 2 to 3 times per week. Norma Cohen reports that her hunger is well controlled when she is eating on the plan. She is doing a great job eating on the plan, even with a lot of stress. Her weight is 203 lb (92.1 kg) today and has had a weight loss of 4 pounds over a period of 3 weeks since her last visit. She has lost 15 lbs since starting treatment with [place].  Vitamin D deficiency Norma Cohen has a diagnosis of vitamin D deficiency. Norma Cohen is currently taking vit D and she denies nausea, vomiting or muscle weakness.  At risk for osteopenia and osteoporosis Norma Cohen is at higher risk of osteopenia and osteoporosis due to vitamin D deficiency.   Hyperlipidemia Norma Cohen has hyperlipidemia and she is not on medications. She has been trying to improve her cholesterol levels with intensive lifestyle modification including a low saturated fat diet, exercise and weight loss. She denies any chest pain.  ASSESSMENT AND PLAN:  Vitamin D deficiency - Plan: Vitamin D, Ergocalciferol, (DRISDOL) 1.25 MG (50000 UT) CAPS capsule  Other hyperlipidemia  At risk for osteopenia  Class 2 severe obesity with serious comorbidity and body mass index (BMI) of 37.0 to 37.9 in adult, unspecified obesity type ([place])  PLAN:  Vitamin D Deficiency Norma Cohen was informed that low vitamin D levels contributes to fatigue and are associated with obesity, breast, and colon cancer. Norma Cohen agrees to continue to take prescription Vit D @50 ,000 IU every week #4 with no refills and she will follow up for routine testing of vitamin D, at least 2-3 times per year. She  was informed of the risk of over-replacement of vitamin D and agrees to not increase her dose unless she discusses this with [place] first. Norma Cohen agrees to follow up as directed.  At risk for osteopenia and osteoporosis Norma Cohen was given extended  (15 minutes) osteoporosis prevention counseling today. Norma Cohen is at risk for osteopenia and osteoporosis due to her vitamin D deficiency. She was encouraged to take her vitamin D and follow her higher calcium diet and increase strengthening exercise to help strengthen her bones and decrease her risk of osteopenia and osteoporosis.  Hyperlipidemia Norma Cohen was informed of the American Heart Association Guidelines emphasizing intensive lifestyle modifications as the first line treatment for hyperlipidemia. We discussed many lifestyle modifications today in depth, and Norma Cohen will continue to work on decreasing saturated fats such as fatty red meat, butter and many fried foods. She will also increase vegetables and lean protein in her diet and continue to work on exercise and weight loss efforts.  Obesity Norma Cohen is currently in the action stage of change. As such, her goal is to continue with weight loss efforts She has agreed to keep a food journal with 550 to 700 calories and 45 grams of protein at supper daily and follow the Category 4 plan Norma Cohen has been instructed to work up to a goal of 150 minutes of combined cardio and strengthening exercise per week for weight loss and overall health benefits. We discussed the following Behavioral Modification Strategies  today: keeping healthy foods in the home and work on meal planning and easy cooking plans  Norma Norma Cohen has agreed to follow up with our clinic in 2 weeks. She was informed of the importance of frequent follow up visits to maximize her success with intensive lifestyle modifications for her multiple health conditions.  ALLERGIES: Allergies  Allergen Reactions   Gluten Meal    Lactose Intolerance (Gi)     Penicillins Rash    MEDICATIONS: Current Outpatient Medications on File Prior to Visit  Medication Sig Dispense Refill   norethindrone-ethinyl estradiol (CYCLAFEM) 0.5/0.75/1-35 MG-MCG tablet Take 1 tablet by mouth daily.     ibuprofen (ADVIL) 200 MG tablet Take 200 mg by mouth every 6 (six) hours as needed.     loratadine (CLARITIN) 10 MG tablet Take 10 mg by mouth daily.     No current facility-administered medications on file prior to visit.     PAST MEDICAL HISTORY: Past Medical History:  Diagnosis Date   ADHD (attention deficit hyperactivity disorder)    Lactose intolerance     PAST SURGICAL HISTORY: History reviewed. No pertinent surgical history.  SOCIAL HISTORY: Social History   Tobacco Use   Smoking status: Never Smoker   Smokeless tobacco: Never Used  Substance Use Topics   Alcohol use: Not on file   Drug use: Not on file    FAMILY HISTORY: Family History  Problem Relation Age of Onset   Diabetes Mother    Anxiety disorder Mother    Obesity Mother    Diabetes Father    Hypertension Father    Cancer Father    Obesity Father     ROS: Review of Systems  Constitutional: Positive for weight loss.  Cardiovascular: Negative for chest pain.  Gastrointestinal: Negative for nausea and vomiting.  Musculoskeletal:       Negative for muscle weakness    PHYSICAL EXAM: Blood pressure 122/75, pulse 72, temperature 98.1 F (36.7 C), temperature source Oral, height 5\' 2"  (1.575 m), weight 203 lb (92.1 kg), SpO2 98 %. Body mass index is 37.13 kg/m. Physical Exam Vitals signs reviewed.  Constitutional:      Appearance: Normal appearance. She is well-developed. She is obese.  Cardiovascular:     Rate and Rhythm: Normal rate.  Pulmonary:     Effort: Pulmonary effort is normal.  Musculoskeletal: Normal range of motion.  Skin:    General: Skin is warm and dry.  Neurological:     Mental Status: She is alert and oriented to person, place, and  time.  Psychiatric:        Mood and Affect: Mood normal.        Behavior: Behavior normal.     RECENT LABS AND TESTS: BMET    Component Value Date/Time   NA 141 06/21/2019 1151   K 4.2 06/21/2019 1151   CL 104 06/21/2019 1151   CO2 18 (L) 06/21/2019 1151   GLUCOSE 91 06/21/2019 1151   BUN 18 06/21/2019 1151   CREATININE 0.61 06/21/2019 1151   CALCIUM 9.5 06/21/2019 1151   GFRNONAA 126 06/21/2019 1151   GFRAA 146 06/21/2019 1151   Lab Results  Component Value Date   HGBA1C 5.2 06/21/2019   HGBA1C 5.3 05/24/2019   Lab Results  Component Value Date   INSULIN 31.7 (H) 06/21/2019   INSULIN CANCELED 05/24/2019   CBC    Component Value Date/Time   WBC 9.7 05/24/2019 1434   RBC 4.99 05/24/2019 1434   HGB 13.9 05/24/2019 1434  HCT 42.2 05/24/2019 1434   MCV 85 05/24/2019 1434   MCH 27.9 05/24/2019 1434   MCHC 32.9 05/24/2019 1434   RDW 13.3 05/24/2019 1434   LYMPHSABS 1.8 05/24/2019 1434   EOSABS 0.1 05/24/2019 1434   BASOSABS 0.0 05/24/2019 1434   Iron/TIBC/Ferritin/ %Sat No results found for: IRON, TIBC, FERRITIN, IRONPCTSAT Lipid Panel     Component Value Date/Time   CHOL 139 06/21/2019 1151   TRIG 70 06/21/2019 1151   HDL 37 (L) 06/21/2019 1151   LDLCALC 88 06/21/2019 1151   Hepatic Function Panel     Component Value Date/Time   PROT 7.6 06/21/2019 1151   ALBUMIN 4.7 06/21/2019 1151   AST 17 06/21/2019 1151   ALT 21 06/21/2019 1151   ALKPHOS 73 06/21/2019 1151   BILITOT 0.3 06/21/2019 1151      Component Value Date/Time   TSH 3.020 06/21/2019 1151   TSH 3.02 06/21/2019   TSH CANCELED 05/24/2019 1434     Ref. Range 06/21/2019 11:51  Vitamin D, 25-Hydroxy Latest Ref Range: 30.0 - 100.0 ng/mL 27.9 (L)    OBESITY BEHAVIORAL INTERVENTION VISIT  Today's visit was # 8   Starting weight: 218 lbs Starting date: 05/24/2019 Today's weight : 203 lbs Today's date: 09/11/2019 Total lbs lost to date: 15    09/11/2019  Height 5\' 2"  (1.575 m)  Weight  203 lb (92.1 kg)  BMI (Calculated) 37.12  BLOOD PRESSURE - SYSTOLIC 122  BLOOD PRESSURE - DIASTOLIC 75   Body Fat % 42.7 %  Total Body Water (lbs) 82.6 lbs    ASK: We discussed the diagnosis of obesity with today and Sherlyn agreed to give Junie Spencer permission to discuss obesity behavioral modification therapy today.  ASSESS: Wynelle has the diagnosis of obesity and her BMI today is 37.12 Sukhman is in the action stage of change   ADVISE: Keiarra was educated on the multiple health risks of obesity as well as the benefit of weight loss to improve her health. She was advised of the need for long term treatment and the importance of lifestyle modifications to improve her current health and to decrease her risk of future health problems.  AGREE: Multiple dietary modification options and treatment options were discussed and  Marleigh agreed to follow the recommendations documented in the above note.  ARRANGE: Jakerria was educated on the importance of frequent visits to treat obesity as outlined per CMS and USPSTF guidelines and agreed to schedule her next follow up appointment today.  Norma Cohen, am acting as transcriptionist for Cristi Loron, PA-C I, Alois Cliche, PA-C have reviewed above note and agree with its content

## 2019-09-13 ENCOUNTER — Ambulatory Visit (INDEPENDENT_AMBULATORY_CARE_PROVIDER_SITE_OTHER): Payer: PRIVATE HEALTH INSURANCE | Admitting: Professional

## 2019-09-13 DIAGNOSIS — F900 Attention-deficit hyperactivity disorder, predominantly inattentive type: Secondary | ICD-10-CM

## 2019-09-13 DIAGNOSIS — F4322 Adjustment disorder with anxiety: Secondary | ICD-10-CM | POA: Diagnosis not present

## 2019-09-18 ENCOUNTER — Ambulatory Visit (INDEPENDENT_AMBULATORY_CARE_PROVIDER_SITE_OTHER): Payer: PRIVATE HEALTH INSURANCE | Admitting: Psychology

## 2019-09-18 ENCOUNTER — Other Ambulatory Visit: Payer: Self-pay

## 2019-09-18 DIAGNOSIS — F3289 Other specified depressive episodes: Secondary | ICD-10-CM | POA: Diagnosis not present

## 2019-10-02 ENCOUNTER — Encounter (INDEPENDENT_AMBULATORY_CARE_PROVIDER_SITE_OTHER): Payer: Self-pay | Admitting: Physician Assistant

## 2019-10-02 ENCOUNTER — Other Ambulatory Visit: Payer: Self-pay

## 2019-10-02 ENCOUNTER — Ambulatory Visit (INDEPENDENT_AMBULATORY_CARE_PROVIDER_SITE_OTHER): Payer: PRIVATE HEALTH INSURANCE | Admitting: Physician Assistant

## 2019-10-02 ENCOUNTER — Ambulatory Visit: Payer: PRIVATE HEALTH INSURANCE | Admitting: Professional

## 2019-10-02 VITALS — BP 138/92 | HR 71 | Temp 98.1°F | Ht 62.0 in | Wt 203.0 lb

## 2019-10-02 DIAGNOSIS — Z9189 Other specified personal risk factors, not elsewhere classified: Secondary | ICD-10-CM

## 2019-10-02 DIAGNOSIS — E559 Vitamin D deficiency, unspecified: Secondary | ICD-10-CM | POA: Diagnosis not present

## 2019-10-02 DIAGNOSIS — E8881 Metabolic syndrome: Secondary | ICD-10-CM

## 2019-10-02 DIAGNOSIS — Z6837 Body mass index (BMI) 37.0-37.9, adult: Secondary | ICD-10-CM

## 2019-10-02 DIAGNOSIS — E7849 Other hyperlipidemia: Secondary | ICD-10-CM

## 2019-10-03 LAB — LIPID PANEL WITH LDL/HDL RATIO
Cholesterol, Total: 164 mg/dL (ref 100–199)
HDL: 41 mg/dL (ref >39)
LDL Chol Calc (NIH): 105 mg/dL — ABNORMAL HIGH (ref 0–99)
LDL/HDL Ratio: 2.6 ratio (ref 0.0–3.2)
Triglycerides: 97 mg/dL (ref 0–149)
VLDL Cholesterol Cal: 18 mg/dL (ref 5–40)

## 2019-10-03 LAB — COMPREHENSIVE METABOLIC PANEL
ALT: 15 IU/L (ref 0–32)
AST: 12 IU/L (ref 0–40)
Albumin/Globulin Ratio: 1.4 (ref 1.2–2.2)
Albumin: 4.3 g/dL (ref 3.9–5.0)
Alkaline Phosphatase: 59 IU/L (ref 39–117)
BUN/Creatinine Ratio: 17 (ref 9–23)
BUN: 12 mg/dL (ref 6–20)
Bilirubin Total: 0.3 mg/dL (ref 0.0–1.2)
CO2: 21 mmol/L (ref 20–29)
Calcium: 9.3 mg/dL (ref 8.7–10.2)
Chloride: 105 mmol/L (ref 96–106)
Creatinine, Ser: 0.7 mg/dL (ref 0.57–1.00)
GFR calc Af Amer: 139 mL/min/{1.73_m2} (ref >59)
GFR calc non Af Amer: 121 mL/min/{1.73_m2} (ref >59)
Globulin, Total: 3 g/dL (ref 1.5–4.5)
Glucose: 86 mg/dL (ref 65–99)
Potassium: 4.6 mmol/L (ref 3.5–5.2)
Sodium: 139 mmol/L (ref 134–144)
Total Protein: 7.3 g/dL (ref 6.0–8.5)

## 2019-10-03 LAB — HEMOGLOBIN A1C
Est. average glucose Bld gHb Est-mCnc: 97 mg/dL
Hgb A1c MFr Bld: 5 % (ref 4.8–5.6)

## 2019-10-03 LAB — VITAMIN D 25 HYDROXY (VIT D DEFICIENCY, FRACTURES): Vit D, 25-Hydroxy: 44.6 ng/mL (ref 30.0–100.0)

## 2019-10-03 LAB — INSULIN, RANDOM: INSULIN: 34.3 u[IU]/mL — ABNORMAL HIGH (ref 2.6–24.9)

## 2019-10-03 NOTE — Progress Notes (Signed)
Office: [phone]  /  Fax: [phone]   HPI:  Chief Complaint: OBESITY Norma Cohen is here to discuss her progress with her obesity treatment plan. She is on the keep a food journal with 550 to 700 calories and 45 grams of protein at supper daily and the Category 4 plan and states she is following her eating plan approximately 70 % of the time. She states she is doing Body Groove 30 minutes 2 to 3 times per week.  Norma Cohen states that she did not eat on the plan the entire time. She indulged in pizza and states that her boyfriend brings things in the house which are temptations for her. She has a birthday coming up.  Hyperlipidemia Norma Cohen has hyperlipidemia and she is not on medications. She is exercising regularly. Norma Cohen has no chest pain.   Vitamin D deficiency Norma Cohen has a diagnosis of vitamin D deficiency. She is on vitamin D. Cera missed last weeks dose. I reviewed the last vitamin D level with patient today.  Insulin Resistance Norma Cohen has a diagnosis of insulin resistance and she is not on medications. She has no polyphagia and she has no cravings.  At risk for cardiovascular disease Norma Cohen is at a higher than average risk for cardiovascular disease due to obesity, hyperlipidemia and insulin resistance. She currently denies any chest pain.    Today's visit was # 9  Starting weight: 218 lbs Starting date: 05/24/2019 Today's weight : 203 lbs  Today's date: 10/02/2019 Total lbs lost to date: 15 Total lbs lost since last in-office visit: 0  ASSESSMENT AND PLAN:  Vitamin D deficiency - Plan: Vitamin D (25 hydroxy)  Insulin resistance - Plan: Comprehensive Metabolic Panel (CMET), HgB A1c, Insulin, random  Other hyperlipidemia - Plan: Lipid Panel With LDL/HDL Ratio  At risk for heart disease  Class 2 severe obesity with serious comorbidity and body mass index (BMI) of 37.0 to 37.9 in adult, unspecified obesity type ([facility])  PLAN:  Hyperlipidemia Intensive lifestyle  modifications as the first line treatment for hyperlipidemia. We discussed many lifestyle modifications today and Norma Cohen will continue to work on diet, exercise and weight loss efforts. We will check labs and Norma Cohen will follow up at the agreed upon time.  Vitamin D Deficiency Low vitamin D level contributes to fatigue and are associated with obesity, breast, and colon cancer. Norma Cohen will continue to take prescription Vit D @50 ,000 IU every week and she will follow up for routine testing of vitamin D, at least 2-3 times per year to avoid over-replacement.  Insulin Resistance Norma Cohen will continue to work on weight loss, exercise, and decreasing simple carbohydrates to help decrease the risk of diabetes. Norma Cohen agreed to follow up with as directed to closely monitor her progress.  Cardiovascular risk counseling Norma Cohen was given (~15 minutes) coronary artery disease prevention counseling today. She is 28 y.o. female and has risk factors for heart disease including obesity, hyperlipidemia and insulin resistance, . We discussed intensive lifestyle modifications today with an emphasis on specific weight loss instructions and strategies.   Obesity Norma Cohen is currently in the action stage of change. As such, her goal is to continue with weight loss efforts She has agreed to keep a food journal with 550 to 700 calories and 45 grams of protein at supper daily and follow the Category 4 plan Norma Cohen has been instructed to work up to a goal of 150 minutes of combined cardio and strengthening exercise per week for weight loss and overall health benefits. We  discussed the following Behavioral Modification Strategies today: increasing lean protein intake, work on meal planning and easy cooking plans and celebration eating strategies   Norma Cohen has agreed to follow up with our clinic in 3 weeks. She was informed of the importance of frequent follow up visits to maximize her success with intensive lifestyle modifications for  her multiple health conditions.  ALLERGIES: Allergies  Allergen Reactions  . Gluten Meal   . Lactose Intolerance (Gi)   . Penicillins Rash    MEDICATIONS: Current Outpatient Medications on File Prior to Visit  Medication Sig Dispense Refill  . ibuprofen (ADVIL) 200 MG tablet Take 200 mg by mouth every 6 (six) hours as needed.    . loratadine (CLARITIN) 10 MG tablet Take 10 mg by mouth daily.    . norethindrone-ethinyl estradiol (CYCLAFEM) 0.5/0.75/1-35 MG-MCG tablet Take 1 tablet by mouth daily.    . Vitamin D, Ergocalciferol, (DRISDOL) 1.25 MG (50000 UT) CAPS capsule Take 1 capsule (50,000 Units total) by mouth every 7 (seven) days. 4 capsule 0   No current facility-administered medications on file prior to visit.    PAST MEDICAL HISTORY: Past Medical History:  Diagnosis Date  . ADHD (attention deficit hyperactivity disorder)   . Lactose intolerance     PAST SURGICAL HISTORY: History reviewed. No pertinent surgical history.  SOCIAL HISTORY: Social History   Tobacco Use  . Smoking status: Never Smoker  . Smokeless tobacco: Never Used  Substance Use Topics  . Alcohol use: Not on file  . Drug use: Not on file    FAMILY HISTORY: Family History  Problem Relation Age of Onset  . Diabetes Mother   . Anxiety disorder Mother   . Obesity Mother   . Diabetes Father   . Hypertension Father   . Cancer Father   . Obesity Father     ROS: Review of Systems  Constitutional: Negative for weight loss.  Cardiovascular: Negative for chest pain.  Endo/Heme/Allergies:       Negative for polyphagia Negative for cravings    PHYSICAL EXAM: Blood pressure (!) 138/92, pulse 71, temperature 98.1 F (36.7 C), temperature source Oral, height 5\' 2"  (1.575 m), weight 203 lb (92.1 kg), last menstrual period 09/25/2019, SpO2 98 %. Body mass index is 37.13 kg/m. Physical Exam Vitals reviewed.  Constitutional:      Appearance: Normal appearance. She is well-developed. She is obese.   Cardiovascular:     Rate and Rhythm: Normal rate.  Pulmonary:     Effort: Pulmonary effort is normal.  Musculoskeletal:        General: Normal range of motion.  Skin:    General: Skin is warm and dry.  Neurological:     Mental Status: She is alert and oriented to person, place, and time.  Psychiatric:        Mood and Affect: Mood normal.        Behavior: Behavior normal.     RECENT LABS AND TESTS: BMET    Component Value Date/Time   NA 141 06/21/2019 1151   K 4.2 06/21/2019 1151   CL 104 06/21/2019 1151   CO2 18 (L) 06/21/2019 1151   GLUCOSE 91 06/21/2019 1151   BUN 18 06/21/2019 1151   CREATININE 0.61 06/21/2019 1151   CALCIUM 9.5 06/21/2019 1151   GFRNONAA 126 06/21/2019 1151   GFRAA 146 06/21/2019 1151   Lab Results  Component Value Date   HGBA1C 5.2 06/21/2019   HGBA1C 5.3 05/24/2019   Lab Results  Component Value  Date   INSULIN 31.7 (H) 06/21/2019   INSULIN CANCELED 05/24/2019   CBC    Component Value Date/Time   WBC 9.7 05/24/2019 1434   RBC 4.99 05/24/2019 1434   HGB 13.9 05/24/2019 1434   HCT 42.2 05/24/2019 1434   MCV 85 05/24/2019 1434   MCH 27.9 05/24/2019 1434   MCHC 32.9 05/24/2019 1434   RDW 13.3 05/24/2019 1434   LYMPHSABS 1.8 05/24/2019 1434   EOSABS 0.1 05/24/2019 1434   BASOSABS 0.0 05/24/2019 1434   Iron/TIBC/Ferritin/ %Sat No results found for: IRON, TIBC, FERRITIN, IRONPCTSAT Lipid Panel     Component Value Date/Time   CHOL 139 06/21/2019 1151   TRIG 70 06/21/2019 1151   HDL 37 (L) 06/21/2019 1151   LDLCALC 88 06/21/2019 1151   Hepatic Function Panel     Component Value Date/Time   PROT 7.6 06/21/2019 1151   ALBUMIN 4.7 06/21/2019 1151   AST 17 06/21/2019 1151   ALT 21 06/21/2019 1151   ALKPHOS 73 06/21/2019 1151   BILITOT 0.3 06/21/2019 1151      Component Value Date/Time   TSH 3.020 06/21/2019 1151   TSH 3.02 06/21/2019 0000   TSH CANCELED 05/24/2019 1434     Ref. Range 06/21/2019 11:51  Vitamin D, 25-Hydroxy  Latest Ref Range: 30.0 - 100.0 ng/mL 27.9 (L)    I, Doreene Nest, am acting as transcriptionist for Abby Potash, PA-C I, Abby Potash, PA-C have reviewed above note and agree with its content

## 2019-10-12 ENCOUNTER — Other Ambulatory Visit (INDEPENDENT_AMBULATORY_CARE_PROVIDER_SITE_OTHER): Payer: Self-pay | Admitting: Physician Assistant

## 2019-10-12 DIAGNOSIS — E559 Vitamin D deficiency, unspecified: Secondary | ICD-10-CM

## 2019-10-21 ENCOUNTER — Other Ambulatory Visit (INDEPENDENT_AMBULATORY_CARE_PROVIDER_SITE_OTHER): Payer: Self-pay | Admitting: Physician Assistant

## 2019-10-21 DIAGNOSIS — E559 Vitamin D deficiency, unspecified: Secondary | ICD-10-CM

## 2019-10-23 ENCOUNTER — Ambulatory Visit (INDEPENDENT_AMBULATORY_CARE_PROVIDER_SITE_OTHER): Payer: PRIVATE HEALTH INSURANCE | Admitting: Physician Assistant

## 2019-10-26 ENCOUNTER — Ambulatory Visit: Payer: PRIVATE HEALTH INSURANCE | Admitting: Professional

## 2019-11-06 ENCOUNTER — Ambulatory Visit (INDEPENDENT_AMBULATORY_CARE_PROVIDER_SITE_OTHER): Payer: 59 | Admitting: Physician Assistant

## 2019-11-06 ENCOUNTER — Other Ambulatory Visit: Payer: Self-pay

## 2019-11-06 ENCOUNTER — Encounter (INDEPENDENT_AMBULATORY_CARE_PROVIDER_SITE_OTHER): Payer: Self-pay | Admitting: Physician Assistant

## 2019-11-06 VITALS — BP 116/80 | HR 85 | Temp 97.9°F | Ht 62.0 in | Wt 202.0 lb

## 2019-11-06 DIAGNOSIS — Z9189 Other specified personal risk factors, not elsewhere classified: Secondary | ICD-10-CM | POA: Diagnosis not present

## 2019-11-06 DIAGNOSIS — E559 Vitamin D deficiency, unspecified: Secondary | ICD-10-CM | POA: Diagnosis not present

## 2019-11-06 DIAGNOSIS — E7849 Other hyperlipidemia: Secondary | ICD-10-CM | POA: Diagnosis not present

## 2019-11-06 DIAGNOSIS — E8881 Metabolic syndrome: Secondary | ICD-10-CM

## 2019-11-06 DIAGNOSIS — Z6836 Body mass index (BMI) 36.0-36.9, adult: Secondary | ICD-10-CM

## 2019-11-06 MED ORDER — VITAMIN D (ERGOCALCIFEROL) 1.25 MG (50000 UNIT) PO CAPS: 50000.0000 [IU] | ORAL_CAPSULE | ORAL | 0 refills | Status: DC

## 2019-11-06 NOTE — Progress Notes (Signed)
Chief Complaint:   OBESITY Nathania Waldman is here to discuss her progress with her obesity treatment plan along with follow-up of her obesity related diagnoses. Tanaka is on the Category 4 Plan and she is keeping a food journal of 550 to 700 calories and 45 grams of protein at supper daily and states she is following her eating plan approximately 50 to 60% of the time. Thressa states she is exercising 0 minutes 0 times per week.  Today's visit was #: 10 Starting weight: 218 lbs Starting date: 05/24/2019 Today's weight: 202 lbs Today's date: 11/06/2019 Total lbs lost to date: 16 Total lbs lost since last in-office visit: 1  Interim History: Pranathi was diagnosed with COVID four weeks ago and she had headache, body aches, fever, fatigue, and nausea. She has been unable to eat all of the food on the plan. She was able to get back on the plan this week.  Subjective:   Vitamin D deficiency Jerlean's Vitamin D level was 44.6 on 10/02/19. She is currently taking weekly vit D. She denies nausea, vomiting or muscle weakness. Labs were discussed with patient today.  Other hyperlipidemia Beulah has hyperlipidemia and she is not on medications. Her last LDL was 105, triglycerides were 164 and HDL was 41. Oleda is not exercising. She has been trying to improve her cholesterol levels with intensive lifestyle modification including a low saturated fat diet, exercise and weight loss. She denies any chest pain or myalgias. Labs were discussed with patient today.  Lab Results  Component Value Date   ALT 15 10/02/2019   AST 12 10/02/2019   ALKPHOS 59 10/02/2019   BILITOT 0.3 10/02/2019   Lab Results  Component Value Date   CHOL 164 10/02/2019   HDL 41 10/02/2019   LDLCALC 105 (H) 10/02/2019   TRIG 97 10/02/2019   Insulin resistance Yolonda has worsening insulin resistance based on her elevated fasting insulin level >5. Her last level was 34.3, and has increased from 31.7. Shawnette has no polyphagia  and she is not on medications. She continues to work on diet and exercise to decrease her risk of diabetes. Labs were discussed with patient today.  Lab Results  Component Value Date   INSULIN 34.3 (H) 10/02/2019   INSULIN 31.7 (H) 06/21/2019   INSULIN CANCELED 05/24/2019   Lab Results  Component Value Date   HGBA1C 5.0 10/02/2019   At risk for osteopenia [place] is at higher risk of osteopenia and osteoporosis due to Vitamin D deficiency.   Assessment/Plan:   Vitamin D deficiency  Low Vitamin D level contributes to fatigue and are associated with obesity, breast, and colon cancer. Daleyza agrees to continue to take prescription Vitamin D @50 ,000 IU every week #4 with no refills and she will follow-up for routine testing of Vitamin D, at least 2-3 times per year to avoid over-replacement.  Other hyperlipidemia Cardiovascular risk and specific lipid/LDL goals reviewed.  We discussed several lifestyle modifications today and Jadore will continue to work on diet, exercise and weight loss efforts. Orders and follow up as documented in patient record.   Counseling Intensive lifestyle modifications are the first line treatment for this issue. . Dietary changes: Increase soluble fiber. Decrease simple carbohydrates. . Exercise changes: Moderate to vigorous-intensity aerobic activity 150 minutes per week if tolerated. . Lipid-lowering medications: see documented in medical record.  Insulin resistance Vivianna will continue to work on weight loss, exercise, and decreasing simple carbohydrates to help decrease the risk of diabetes.  Lilo will continue with the plan and follow-up with [place] as directed to closely monitor her progress.  At risk for osteopenia Kynsleigh was given approximately 15 minutes of osteoporosis prevention counseling today. Fatema is at risk for osteopenia and osteoporosis due to her Vitamin D deficiency. She was encouraged to take her Vitamin D and follow her higher calcium diet and  increase strengthening exercise to help strengthen her bones and decrease her risk of osteopenia and osteoporosis.  Obesity Yasheka is currently in the action stage of change. As such, her goal is to continue with weight loss efforts. She has agreed to keeping a food journal and adhering to recommended goals of 1800 calories and 115 grams of protein daily.   Exercise goals: For substantial health benefits, adults should do at least 150 minutes (2 hours and 30 minutes) a week of moderate-intensity, or 75 minutes (1 hour and 15 minutes) a week of vigorous-intensity aerobic physical activity, or an equivalent combination of moderate- and vigorous-intensity aerobic activity. Aerobic activity should be performed in episodes of at least 10 minutes, and preferably, it should be spread throughout the week. Adults should also include muscle-strengthening activities that involve all major muscle groups on 2 or more days a week.  Behavioral modification strategies: no skipping meals and meal planning and cooking strategies.  Jeimy has agreed to follow-up with our clinic in 2 weeks. She was informed of the importance of frequent follow-up visits to maximize her success with intensive lifestyle modifications for her multiple health conditions.   Objective:   Blood pressure 116/80, pulse 85, temperature 97.9 F (36.6 C), temperature source Oral, height 5\' 2"  (1.575 m), weight 202 lb (91.6 kg), SpO2 99 %. Body mass index is 36.95 kg/m.  General: Cooperative, alert, well developed, in no acute distress. HEENT: Conjunctivae and lids unremarkable. Cardiovascular: Regular rhythm.  Lungs: Normal work of breathing. Neurologic: No focal deficits.   Lab Results  Component Value Date   CREATININE 0.70 10/02/2019   BUN 12 10/02/2019   NA 139 10/02/2019   K 4.6 10/02/2019   CL 105 10/02/2019   CO2 21 10/02/2019   Lab Results  Component Value Date   ALT 15 10/02/2019   AST 12 10/02/2019   ALKPHOS 59  10/02/2019   BILITOT 0.3 10/02/2019   Lab Results  Component Value Date   HGBA1C 5.0 10/02/2019   HGBA1C 5.2 06/21/2019   HGBA1C 5.3 05/24/2019   Lab Results  Component Value Date   INSULIN 34.3 (H) 10/02/2019   INSULIN 31.7 (H) 06/21/2019   INSULIN CANCELED 05/24/2019   Lab Results  Component Value Date   TSH 3.020 06/21/2019   Lab Results  Component Value Date   CHOL 164 10/02/2019   HDL 41 10/02/2019   LDLCALC 105 (H) 10/02/2019   TRIG 97 10/02/2019   Lab Results  Component Value Date   WBC 9.7 05/24/2019   HGB 13.9 05/24/2019   HCT 42.2 05/24/2019   MCV 85 05/24/2019   No results found for: IRON, TIBC, FERRITIN   Ref. Range 10/02/2019 08:34  Vitamin D, 25-Hydroxy Latest Ref Range: 30.0 - 100.0 ng/mL 44.6    Attestation Statements:   Reviewed by clinician on day of visit: allergies, medications, problem list, medical history, surgical history, family history, social history, and previous encounter notes.  Corey Skains, am acting as Location manager for [employer], PA-C.  I have reviewed the above documentation for accuracy and completeness, and I agree with the above. Abby Potash,  PA-C

## 2019-11-09 ENCOUNTER — Ambulatory Visit: Payer: PRIVATE HEALTH INSURANCE | Admitting: Professional

## 2019-11-16 ENCOUNTER — Ambulatory Visit: Payer: PRIVATE HEALTH INSURANCE | Admitting: Professional

## 2019-11-23 ENCOUNTER — Encounter (INDEPENDENT_AMBULATORY_CARE_PROVIDER_SITE_OTHER): Payer: Self-pay | Admitting: Family Medicine

## 2019-11-23 ENCOUNTER — Other Ambulatory Visit: Payer: Self-pay

## 2019-11-23 ENCOUNTER — Ambulatory Visit: Payer: PRIVATE HEALTH INSURANCE | Admitting: Professional

## 2019-11-23 ENCOUNTER — Ambulatory Visit (INDEPENDENT_AMBULATORY_CARE_PROVIDER_SITE_OTHER): Payer: 59 | Admitting: Family Medicine

## 2019-11-23 VITALS — BP 120/90 | HR 90 | Temp 98.3°F | Ht 62.0 in | Wt 203.0 lb

## 2019-11-23 DIAGNOSIS — F419 Anxiety disorder, unspecified: Secondary | ICD-10-CM | POA: Diagnosis not present

## 2019-11-23 DIAGNOSIS — E559 Vitamin D deficiency, unspecified: Secondary | ICD-10-CM | POA: Diagnosis not present

## 2019-11-23 DIAGNOSIS — Z6837 Body mass index (BMI) 37.0-37.9, adult: Secondary | ICD-10-CM

## 2019-11-23 DIAGNOSIS — Z9189 Other specified personal risk factors, not elsewhere classified: Secondary | ICD-10-CM

## 2019-11-23 MED ORDER — VITAMIN D (ERGOCALCIFEROL) 1.25 MG (50000 UNIT) PO CAPS: 50000.0000 [IU] | ORAL_CAPSULE | ORAL | 0 refills | Status: DC

## 2019-11-23 NOTE — Progress Notes (Signed)
Chief Complaint:   OBESITY Norma Cohen is here to discuss her progress with her obesity treatment plan along with follow-up of her obesity related diagnoses. Norma Cohen is keeping a food journal of 1800 calories and 115 grams of protein daily plan and states she is following her eating plan approximately 80% of the time. Norma Cohen states she is doing Body Groove 29 to 32 minutes 2 times per week.  Today's visit was #: 11 Starting weight: 218 lbs Starting date: 05/24/2019 Today's weight: 203 lbs Today's date: 11/23/2019 Total lbs lost to date: 15 Total lbs lost since last in-office visit: 0  Interim History: Norma Cohen has been working towards 150 grams of protein daily. She has been so busy, she hasn't paid attention to eating. Norma Cohen has had more cravings for chocolate.  Subjective:   Vitamin D deficiency  Annalyssa's Vitamin D level was 44.6 on 10/02/19. She is currently taking vit D. She admits fatigue and denies nausea, vomiting or muscle weakness.  Anxiety Norma Cohen has had two attacks of anxiousness while in a restaurant waiting for food.  At risk for osteoporosis Norma Cohen is at higher risk of osteopenia and osteoporosis due to Vitamin D deficiency.   Assessment/Plan:   Vitamin D deficiency  Low Vitamin D level contributes to fatigue and are associated with obesity, breast, and colon cancer. Norma Cohen agrees to continue to take prescription Vitamin D @50 ,000 IU every week #4 with no refills and she will follow-up for routine testing of Vitamin D, at least 2-3 times per year to avoid over-replacement.  Anxiety We will follow up at the next appointment. Medication was discussed. Patient will let me know if she would like to pursue medication option at the next appointment.  At risk for osteoporosis Norma Cohen was given approximately 15 minutes of osteoporosis prevention counseling today. Norma Cohen is at risk for osteopenia and osteoporosis due to her Vitamin D deficiency. She was encouraged to take her  Vitamin D and follow her higher calcium diet and increase strengthening exercise to help strengthen her bones and decrease her risk of osteopenia and osteoporosis.  Repetitive spaced learning was employed today to elicit superior memory formation and behavioral change.  Class 2 severe obesity with serious comorbidity and body mass index (BMI) of 37.0 to 37.9 in adult, unspecified obesity type (HCC)  Norma Cohen is currently in the action stage of change. As such, her goal is to continue with weight loss efforts. She has agreed to keeping a food journal and adhering to recommended goals of 1650 to 1800 calories and 115+ grams of protein daily.   Exercise goals: Norma Cohen will continue her current exercise regimen.  Behavioral modification strategies: increasing lean protein intake, increasing vegetables, meal planning and cooking strategies, keeping healthy foods in the home and planning for success.  Norma Cohen has agreed to follow-up with our clinic in 2 weeks. She was informed of the importance of frequent follow-up visits to maximize her success with intensive lifestyle modifications for her multiple health conditions.   Objective:   Blood pressure 120/90, pulse 90, temperature 98.3 F (36.8 C), temperature source Oral, height 5\' 2"  (1.575 m), weight 203 lb (92.1 kg), last menstrual period 10/23/2019, SpO2 96 %. Body mass index is 37.13 kg/m.  General: Cooperative, alert, well developed, in no acute distress. HEENT: Conjunctivae and lids unremarkable. Cardiovascular: Regular rhythm.  Lungs: Normal work of breathing. Neurologic: No focal deficits.   Lab Results  Component Value Date   CREATININE 0.70 10/02/2019   BUN 12 10/02/2019  NA 139 10/02/2019   K 4.6 10/02/2019   CL 105 10/02/2019   CO2 21 10/02/2019   Lab Results  Component Value Date   ALT 15 10/02/2019   AST 12 10/02/2019   ALKPHOS 59 10/02/2019   BILITOT 0.3 10/02/2019   Lab Results  Component Value Date   HGBA1C 5.0  10/02/2019   HGBA1C 5.2 06/21/2019   HGBA1C 5.3 05/24/2019   Lab Results  Component Value Date   INSULIN 34.3 (H) 10/02/2019   INSULIN 31.7 (H) 06/21/2019   INSULIN CANCELED 05/24/2019   Lab Results  Component Value Date   TSH 3.020 06/21/2019   Lab Results  Component Value Date   CHOL 164 10/02/2019   HDL 41 10/02/2019   LDLCALC 105 (H) 10/02/2019   TRIG 97 10/02/2019   Lab Results  Component Value Date   WBC 9.7 05/24/2019   HGB 13.9 05/24/2019   HCT 42.2 05/24/2019   MCV 85 05/24/2019   No results found for: IRON, TIBC, FERRITIN   Ref. Range 10/02/2019 08:34  Vitamin D, 25-Hydroxy Latest Ref Range: 30.0 - 100.0 ng/mL 44.6    Attestation Statements:   Reviewed by clinician on day of visit: allergies, medications, problem list, medical history, surgical history, family history, social history, and previous encounter notes.  I, [place] Crane, am acting as transcriptionist for Filbert Schilder, MD.  I have reviewed the above documentation for accuracy and completeness, and I agree with the above. - Debbra Riding, MD

## 2019-11-29 ENCOUNTER — Other Ambulatory Visit (INDEPENDENT_AMBULATORY_CARE_PROVIDER_SITE_OTHER): Payer: Self-pay | Admitting: Physician Assistant

## 2019-11-29 DIAGNOSIS — E559 Vitamin D deficiency, unspecified: Secondary | ICD-10-CM

## 2019-11-30 ENCOUNTER — Ambulatory Visit: Payer: PRIVATE HEALTH INSURANCE | Admitting: Professional

## 2019-12-07 ENCOUNTER — Ambulatory Visit: Payer: PRIVATE HEALTH INSURANCE | Admitting: Professional

## 2019-12-07 ENCOUNTER — Ambulatory Visit (INDEPENDENT_AMBULATORY_CARE_PROVIDER_SITE_OTHER): Payer: 59 | Admitting: Professional

## 2019-12-07 DIAGNOSIS — F4322 Adjustment disorder with anxiety: Secondary | ICD-10-CM | POA: Diagnosis not present

## 2019-12-07 DIAGNOSIS — F900 Attention-deficit hyperactivity disorder, predominantly inattentive type: Secondary | ICD-10-CM | POA: Diagnosis not present

## 2019-12-12 ENCOUNTER — Ambulatory Visit (INDEPENDENT_AMBULATORY_CARE_PROVIDER_SITE_OTHER): Payer: 59 | Admitting: Family Medicine

## 2019-12-14 ENCOUNTER — Ambulatory Visit: Payer: PRIVATE HEALTH INSURANCE | Admitting: Professional

## 2019-12-21 ENCOUNTER — Ambulatory Visit: Payer: PRIVATE HEALTH INSURANCE | Admitting: Professional

## 2019-12-23 ENCOUNTER — Other Ambulatory Visit (INDEPENDENT_AMBULATORY_CARE_PROVIDER_SITE_OTHER): Payer: Self-pay | Admitting: Family Medicine

## 2019-12-23 DIAGNOSIS — E559 Vitamin D deficiency, unspecified: Secondary | ICD-10-CM

## 2019-12-26 ENCOUNTER — Other Ambulatory Visit: Payer: Self-pay

## 2019-12-26 ENCOUNTER — Ambulatory Visit (INDEPENDENT_AMBULATORY_CARE_PROVIDER_SITE_OTHER): Payer: 59 | Admitting: Family Medicine

## 2019-12-26 ENCOUNTER — Encounter (INDEPENDENT_AMBULATORY_CARE_PROVIDER_SITE_OTHER): Payer: Self-pay | Admitting: Family Medicine

## 2019-12-26 VITALS — BP 127/67 | HR 85 | Temp 98.1°F | Ht 62.0 in | Wt 205.0 lb

## 2019-12-26 DIAGNOSIS — E559 Vitamin D deficiency, unspecified: Secondary | ICD-10-CM

## 2019-12-26 DIAGNOSIS — E88819 Insulin resistance, unspecified: Secondary | ICD-10-CM

## 2019-12-26 DIAGNOSIS — Z9189 Other specified personal risk factors, not elsewhere classified: Secondary | ICD-10-CM

## 2019-12-26 DIAGNOSIS — E8881 Metabolic syndrome: Secondary | ICD-10-CM

## 2019-12-26 DIAGNOSIS — Z6837 Body mass index (BMI) 37.0-37.9, adult: Secondary | ICD-10-CM

## 2019-12-26 MED ORDER — VITAMIN D (ERGOCALCIFEROL) 1.25 MG (50000 UNIT) PO CAPS: 50000.0000 [IU] | ORAL_CAPSULE | ORAL | 0 refills | Status: DC

## 2019-12-26 NOTE — Progress Notes (Signed)
Chief Complaint:   OBESITY Norma Cohen is here to discuss her progress with her obesity treatment plan along with follow-up of her obesity related diagnoses. Norma Cohen is on keeping a food journal and adhering to recommended goals of 1650 to 1800 calories and 115 grams of protein daily and states she is following her eating plan approximately 40% of the time. Norma Cohen states she is doing Body Groove and HIIT 30 to 35 minutes 3 to 5 times per week.  Today's visit was #: 12 Starting weight: 218 lbs Starting date: 05/24/2019 Today's weight: 205 lbs Today's date: 12/26/2019 Total lbs lost to date: 13 Total lbs lost since last in-office visit: 0  Interim History: Norma Cohen has started seeing a mental health counselor, and she has really struggled with depression; and so she had to reschedule. Patient really hasn't cooked for herself in the past few weeks. She hasn't really logged food either.  Subjective:   Vitamin D deficiency  Norma Cohen's Vitamin D level was 44.6 on 10/02/19. She is on prescription vit D replacement. She admits fatigue and denies nausea, vomiting or muscle weakness.  Insulin resistance Norma Cohen has a diagnosis of insulin resistance and her last insulin level was 34.3 and Hgb A1c was 5.0. She is not on medications. She continues to work on diet and exercise to decrease her risk of diabetes. Norma Cohen admits to carb cravings.  Lab Results  Component Value Date   INSULIN 34.3 (H) 10/02/2019   INSULIN 31.7 (H) 06/21/2019   INSULIN CANCELED 05/24/2019   Lab Results  Component Value Date   HGBA1C 5.0 10/02/2019   At risk for osteoporosis Norma Cohen is at higher risk of osteopenia and osteoporosis due to Vitamin D deficiency.   Assessment/Plan:   Vitamin D deficiency  Low Vitamin D level contributes to fatigue and are associated with obesity, breast, and colon cancer. Norma Cohen agrees to continue to take prescription Vitamin D @50 ,000 IU every week #4 with no refills and she will follow-up  for routine testing of Vitamin D, at least 2-3 times per year to avoid over-replacement.  Insulin resistance Norma Cohen will continue to work on weight loss, exercise, and decreasing simple carbohydrates to help decrease the risk of diabetes. We will repeat labs at the next appointment. Norma Cohen agreed to follow-up with as directed to closely monitor her progress.  At risk for osteoporosis Norma Cohen was given approximately 15 minutes of osteoporosis prevention counseling today. Norma Cohen is at risk for osteopenia and osteoporosis due to her Vitamin D deficiency. She was encouraged to take her Vitamin D and follow her higher calcium diet and increase strengthening exercise to help strengthen her bones and decrease her risk of osteopenia and osteoporosis.  Repetitive spaced learning was employed today to elicit superior memory formation and behavioral change.  Class 2 severe obesity with serious comorbidity and body mass index (BMI) of 37.0 to 37.9 in adult, unspecified obesity type (HCC) Norma Cohen is currently in the action stage of change. As such, her goal is to continue with weight loss efforts. She has agreed to the Category 4 Plan.   Exercise goals: Norma Cohen will continue her current exercise regimen.  Behavioral modification strategies: increasing lean protein intake, increasing vegetables, meal planning and cooking strategies, keeping healthy foods in the home and planning for success.  Norma Cohen has agreed to follow-up with our clinic in 2 weeks. She was informed of the importance of frequent follow-up visits to maximize her success with intensive lifestyle modifications for her multiple health conditions.  Objective:   Blood pressure 127/67, pulse 85, temperature 98.1 F (36.7 C), temperature source Oral, height 5\' 2"  (1.575 m), weight 205 lb (93 kg), last menstrual period 12/04/2019, SpO2 98 %. Body mass index is 37.49 kg/m.  General: Cooperative, alert, well developed, in no acute distress. HEENT:  Conjunctivae and lids unremarkable. Cardiovascular: Regular rhythm.  Lungs: Normal work of breathing. Neurologic: No focal deficits.   Lab Results  Component Value Date   CREATININE 0.70 10/02/2019   BUN 12 10/02/2019   NA 139 10/02/2019   K 4.6 10/02/2019   CL 105 10/02/2019   CO2 21 10/02/2019   Lab Results  Component Value Date   ALT 15 10/02/2019   AST 12 10/02/2019   ALKPHOS 59 10/02/2019   BILITOT 0.3 10/02/2019   Lab Results  Component Value Date   HGBA1C 5.0 10/02/2019   HGBA1C 5.2 06/21/2019   HGBA1C 5.3 05/24/2019   Lab Results  Component Value Date   INSULIN 34.3 (H) 10/02/2019   INSULIN 31.7 (H) 06/21/2019   INSULIN CANCELED 05/24/2019   Lab Results  Component Value Date   TSH 3.020 06/21/2019   Lab Results  Component Value Date   CHOL 164 10/02/2019   HDL 41 10/02/2019   LDLCALC 105 (H) 10/02/2019   TRIG 97 10/02/2019   Lab Results  Component Value Date   WBC 9.7 05/24/2019   HGB 13.9 05/24/2019   HCT 42.2 05/24/2019   MCV 85 05/24/2019   No results found for: IRON, TIBC, FERRITIN   Ref. Range 10/02/2019 08:34  Vitamin D, 25-Hydroxy Latest Ref Range: 30.0 - 100.0 ng/mL 44.6    Attestation Statements:   Reviewed by clinician on day of visit: allergies, medications, problem list, medical history, surgical history, family history, social history, and previous encounter notes.  I, Doreene Nest, am acting as transcriptionist for Coralie Common, MD  I have reviewed the above documentation for accuracy and completeness, and I agree with the above. - Ilene Qua, MD

## 2019-12-28 ENCOUNTER — Ambulatory Visit: Payer: PRIVATE HEALTH INSURANCE | Admitting: Professional

## 2020-01-04 ENCOUNTER — Ambulatory Visit: Payer: PRIVATE HEALTH INSURANCE | Admitting: Professional

## 2020-01-06 ENCOUNTER — Ambulatory Visit (INDEPENDENT_AMBULATORY_CARE_PROVIDER_SITE_OTHER): Payer: 59 | Admitting: Professional

## 2020-01-06 DIAGNOSIS — F4322 Adjustment disorder with anxiety: Secondary | ICD-10-CM

## 2020-01-06 DIAGNOSIS — F900 Attention-deficit hyperactivity disorder, predominantly inattentive type: Secondary | ICD-10-CM

## 2020-01-09 ENCOUNTER — Encounter (INDEPENDENT_AMBULATORY_CARE_PROVIDER_SITE_OTHER): Payer: Self-pay | Admitting: Family Medicine

## 2020-01-09 ENCOUNTER — Other Ambulatory Visit: Payer: Self-pay

## 2020-01-09 ENCOUNTER — Ambulatory Visit (INDEPENDENT_AMBULATORY_CARE_PROVIDER_SITE_OTHER): Payer: 59 | Admitting: Family Medicine

## 2020-01-09 VITALS — BP 125/81 | HR 80 | Temp 98.1°F | Ht 62.0 in | Wt 203.0 lb

## 2020-01-09 DIAGNOSIS — E559 Vitamin D deficiency, unspecified: Secondary | ICD-10-CM | POA: Diagnosis not present

## 2020-01-09 DIAGNOSIS — Z6837 Body mass index (BMI) 37.0-37.9, adult: Secondary | ICD-10-CM

## 2020-01-09 DIAGNOSIS — E8881 Metabolic syndrome: Secondary | ICD-10-CM

## 2020-01-09 NOTE — Progress Notes (Signed)
Chief Complaint:   OBESITY Norma Cohen is here to discuss her progress with her obesity treatment plan along with follow-up of her obesity related diagnoses. Norma Cohen is on the Category 4 Plan and states she is following her eating plan approximately 80% of the time. Norma Cohen states she is doing Body Groove 30-35 minutes 3-4 times per week.  Today's visit was #: 39 Starting weight: 218 lbs Starting date: 05/24/2019 Today's weight: 203 lbs Today's date: 01/09/2020 Total lbs lost to date: 15 Total lbs lost since last in-office visit: 2  Interim History: Norma Cohen voices she is a bit tired today, but otherwise she's doing well. Over the last few weeks, she was on plan the majority of the time. Two days ago she did some emotional eating with a sub from [place] and 4 cookies. She is going to [place] at the end of this month. She will be staying in a cabin with 6 people and will be there for a few days.  Subjective:   Insulin resistance. Norma Cohen has a diagnosis of insulin resistance based on her elevated fasting insulin level >5. She continues to work on diet and exercise to decrease her risk of diabetes. Norma Cohen had a HgA1c of 5.2 and insulin of 31.7 initially, but A1c was 5.0 and insulin 34.3 in December.  Lab Results  Component Value Date   INSULIN 34.3 (H) 10/02/2019   INSULIN 31.7 (H) 06/21/2019   INSULIN CANCELED 05/24/2019   Lab Results  Component Value Date   HGBA1C 5.0 10/02/2019    Vitamin D deficiency. No nausea, vomiting, or muscle weakness. She endorses fatigue. Last Vitamin D 44.6 on 10/02/2019.  Assessment/Plan:   Insulin resistance. Norma Cohen will continue to work on weight loss, exercise, and decreasing simple carbohydrates to help decrease the risk of diabetes. Norma Cohen agreed to follow-up with [place] as directed to closely monitor her progress. She will have labs at her next appointment.  Vitamin D deficiency. Low Vitamin D level contributes to fatigue and are associated  with obesity, breast, and colon cancer. She agrees to continue to take prescription Vitamin D (no refill needed) as directed and will follow-up for routine testing of Vitamin D, at least 2-3 times per year to avoid over-replacement.  Class 2 severe obesity with serious comorbidity and body mass index (BMI) of 37.0 to 37.9 in adult, unspecified obesity type (Norma Cohen).  Norma Cohen is currently in the action stage of change. As such, her goal is to continue with weight loss efforts. She has agreed to the Category 4 Plan.   Exercise goals: For substantial health benefits, adults should do at least 150 minutes (2 hours and 30 minutes) a week of moderate-intensity, or 75 minutes (1 hour and 15 minutes) a week of vigorous-intensity aerobic physical activity, or an equivalent combination of moderate- and vigorous-intensity aerobic activity. Aerobic activity should be performed in episodes of at least 10 minutes, and preferably, it should be spread throughout the week.  Behavioral modification strategies: increasing lean protein intake, increasing vegetables, meal planning and cooking strategies, keeping healthy foods in the home and planning for success.  [place] has agreed to follow-up with our clinic in 2 weeks. She was informed of the importance of frequent follow-up visits to maximize her success with intensive lifestyle modifications for her multiple health conditions.   Objective:   Blood pressure 125/81, pulse 80, temperature 98.1 F (36.7 C), temperature source Oral, height 5\' 2"  (1.575 m), weight 203 lb (92.1 kg), last menstrual period 12/20/2019,  SpO2 98 %. Body mass index is 37.13 kg/m.  General: Cooperative, alert, well developed, in no acute distress. HEENT: Conjunctivae and lids unremarkable. Cardiovascular: Regular rhythm.  Lungs: Normal work of breathing. Neurologic: No focal deficits.   Lab Results  Component Value Date   CREATININE 0.70 10/02/2019   BUN 12 10/02/2019   NA 139 10/02/2019     K 4.6 10/02/2019   CL 105 10/02/2019   CO2 21 10/02/2019   Lab Results  Component Value Date   ALT 15 10/02/2019   AST 12 10/02/2019   ALKPHOS 59 10/02/2019   BILITOT 0.3 10/02/2019   Lab Results  Component Value Date   HGBA1C 5.0 10/02/2019   HGBA1C 5.2 06/21/2019   HGBA1C 5.3 05/24/2019   Lab Results  Component Value Date   INSULIN 34.3 (H) 10/02/2019   INSULIN 31.7 (H) 06/21/2019   INSULIN CANCELED 05/24/2019   Lab Results  Component Value Date   TSH 3.020 06/21/2019   Lab Results  Component Value Date   CHOL 164 10/02/2019   HDL 41 10/02/2019   LDLCALC 105 (H) 10/02/2019   TRIG 97 10/02/2019   Lab Results  Component Value Date   WBC 9.7 05/24/2019   HGB 13.9 05/24/2019   HCT 42.2 05/24/2019   MCV 85 05/24/2019   No results found for: IRON, TIBC, FERRITIN  Attestation Statements:   Reviewed by clinician on day of visit: allergies, medications, problem list, medical history, surgical history, family history, social history, and previous encounter notes.  Time spent on visit including pre-visit chart review and post-visit charting and care was 15 minutes.   I, Marianna Payment, am acting as transcriptionist for Reuben Likes, MD   I have reviewed the above documentation for accuracy and completeness, and I agree with the above. - Debbra Riding, MD

## 2020-01-11 ENCOUNTER — Ambulatory Visit: Payer: PRIVATE HEALTH INSURANCE | Admitting: Professional

## 2020-01-18 ENCOUNTER — Ambulatory Visit (INDEPENDENT_AMBULATORY_CARE_PROVIDER_SITE_OTHER): Payer: 59 | Admitting: Professional

## 2020-01-18 ENCOUNTER — Ambulatory Visit: Payer: PRIVATE HEALTH INSURANCE | Admitting: Professional

## 2020-01-18 DIAGNOSIS — F4322 Adjustment disorder with anxiety: Secondary | ICD-10-CM

## 2020-01-18 DIAGNOSIS — F900 Attention-deficit hyperactivity disorder, predominantly inattentive type: Secondary | ICD-10-CM | POA: Diagnosis not present

## 2020-01-22 ENCOUNTER — Other Ambulatory Visit (INDEPENDENT_AMBULATORY_CARE_PROVIDER_SITE_OTHER): Payer: Self-pay | Admitting: Family Medicine

## 2020-01-22 DIAGNOSIS — E559 Vitamin D deficiency, unspecified: Secondary | ICD-10-CM

## 2020-02-01 ENCOUNTER — Ambulatory Visit: Payer: 59 | Admitting: Professional

## 2020-02-05 ENCOUNTER — Encounter (INDEPENDENT_AMBULATORY_CARE_PROVIDER_SITE_OTHER): Payer: Self-pay | Admitting: Family Medicine

## 2020-02-05 ENCOUNTER — Ambulatory Visit (INDEPENDENT_AMBULATORY_CARE_PROVIDER_SITE_OTHER): Payer: 59 | Admitting: Family Medicine

## 2020-02-05 ENCOUNTER — Other Ambulatory Visit: Payer: Self-pay

## 2020-02-05 VITALS — BP 129/80 | HR 87 | Temp 97.7°F | Ht 62.0 in | Wt 206.0 lb

## 2020-02-05 DIAGNOSIS — E559 Vitamin D deficiency, unspecified: Secondary | ICD-10-CM

## 2020-02-05 DIAGNOSIS — E7849 Other hyperlipidemia: Secondary | ICD-10-CM | POA: Diagnosis not present

## 2020-02-05 DIAGNOSIS — E88819 Insulin resistance, unspecified: Secondary | ICD-10-CM

## 2020-02-05 DIAGNOSIS — E8881 Metabolic syndrome: Secondary | ICD-10-CM

## 2020-02-05 DIAGNOSIS — Z9189 Other specified personal risk factors, not elsewhere classified: Secondary | ICD-10-CM | POA: Diagnosis not present

## 2020-02-05 DIAGNOSIS — E66812 Obesity, class 2: Secondary | ICD-10-CM

## 2020-02-05 DIAGNOSIS — Z6837 Body mass index (BMI) 37.0-37.9, adult: Secondary | ICD-10-CM

## 2020-02-06 LAB — VITAMIN D 25 HYDROXY (VIT D DEFICIENCY, FRACTURES): Vit D, 25-Hydroxy: 51.4 ng/mL (ref 30.0–100.0)

## 2020-02-06 LAB — LIPID PANEL WITH LDL/HDL RATIO
Cholesterol, Total: 196 mg/dL (ref 100–199)
HDL: 46 mg/dL (ref >39)
LDL Chol Calc (NIH): 122 mg/dL — ABNORMAL HIGH (ref 0–99)
LDL/HDL Ratio: 2.7 ratio (ref 0.0–3.2)
Triglycerides: 156 mg/dL — ABNORMAL HIGH (ref 0–149)
VLDL Cholesterol Cal: 28 mg/dL (ref 5–40)

## 2020-02-06 LAB — HEMOGLOBIN A1C
Est. average glucose Bld gHb Est-mCnc: 100 mg/dL
Hgb A1c MFr Bld: 5.1 % (ref 4.8–5.6)

## 2020-02-06 LAB — INSULIN, RANDOM: INSULIN: 25.2 u[IU]/mL — ABNORMAL HIGH (ref 2.6–24.9)

## 2020-02-06 MED ORDER — VITAMIN D (ERGOCALCIFEROL) 1.25 MG (50000 UNIT) PO CAPS: 50000.0000 [IU] | ORAL_CAPSULE | ORAL | 0 refills | Status: DC

## 2020-02-06 NOTE — Progress Notes (Signed)
Chief Complaint:   OBESITY Norma Cohen is here to discuss her progress with her obesity treatment plan along with follow-up of her obesity related diagnoses. Norma Cohen is on the Category 4 Plan and states she is following her eating plan approximately 70% of the time. Norma Cohen states she is doing [employer] for 30 minutes 3-4 times per week.  Today's visit was #: 14 Starting weight: 218 lbs Starting date: 05/24/2019 Today's weight: 206 lbs Today's date: 02/05/2020 Total lbs lost to date: 12 Total lbs lost since last in-office visit: 0  Interim History: Norma Cohen has been dealing with neck pain and has been taking muscle relaxers. She voices she is lacking motivation to cook so she has found it hard to stick to the meal plan. She voices she isn't that creative with putting food together and she doesn't know how to cook per say.  Subjective:   1. Vitamin D deficiency [employer] denies nausea, vomiting, or muscle weakness, but she notes fatigue. She is on prescription Vit D.  2. Insulin resistance Norma Cohen's last Hgb A1c was 5.0 and insulin of 34.3. She is on medications.  3. Other hyperlipidemia Norma Cohen's last LDL was 105, HDL 41, and triglycerides 97. She could not risk stratify on ASCVD calculation due to age.  4. At risk for heart disease Norma Cohen is at a higher than average risk for cardiovascular disease due to obesity.   Assessment/Plan:   1. Vitamin D deficiency Low Vitamin D level contributes to fatigue and are associated with obesity, breast, and colon cancer. We will refill prescription Vitamin D for 1 month. [name] will follow-up for routine testing of Vitamin D, at least 2-3 times per year to avoid over-replacement. We will check labs today.  - VITAMIN D 25 Hydroxy (Vit-D Deficiency, Fractures) - Vitamin D, Ergocalciferol, (DRISDOL) 1.25 MG (50000 UNIT) CAPS capsule; Take 1 capsule (50,000 Units total) by mouth every 7 (seven) days.  Dispense: 4 capsule; Refill: 0  2. Insulin resistance Chase  will continue to work on weight loss, exercise, and decreasing simple carbohydrates to help decrease the risk of diabetes. We will check labs today. [name] agreed to follow-up with [place] as directed to closely monitor her progress.  - Hemoglobin A1c - Insulin, random  3. Other hyperlipidemia Cardiovascular risk and specific lipid/LDL goals reviewed. We discussed several lifestyle modifications today and [name] will continue to work on diet, exercise and weight loss efforts. We will check labs today. Orders and follow up as documented in patient record.   Counseling Intensive lifestyle modifications are the first line treatment for this issue. . Dietary changes: Increase soluble fiber. Decrease simple carbohydrates. . Exercise changes: Moderate to vigorous-intensity aerobic activity 150 minutes per week if tolerated. . Lipid-lowering medications: see documented in medical record.  - Lipid Panel With LDL/HDL Ratio  4. At risk for heart disease Norma Cohen was given approximately 15 minutes of coronary artery disease prevention counseling today. She is 29 y.o. female and has risk factors for heart disease including obesity. We discussed intensive lifestyle modifications today with an emphasis on specific weight loss instructions and strategies.   Repetitive spaced learning was employed today to elicit superior memory formation and behavioral change.  5. Class 2 severe obesity with serious comorbidity and body mass index (BMI) of 37.0 to 37.9 in adult, unspecified obesity type (HCC) Norma Cohen is currently in the action stage of change. As such, her goal is to continue with weight loss efforts. She has agreed to the Category 4 Plan and  keeping a food journal and adhering to recommended goals of 550-700 calories and 50+ grams of protein at supper daily.   Exercise goals: As is.  Behavioral modification strategies: increasing lean protein intake, increasing vegetables, meal planning and cooking strategies,  planning for success and keeping a strict food journal.  Norma Cohen has agreed to follow-up with our clinic in 2 weeks. She was informed of the importance of frequent follow-up visits to maximize her success with intensive lifestyle modifications for her multiple health conditions.   [name] was informed we would discuss her lab results at her next visit unless there is a critical issue that needs to be addressed sooner. Norma Cohen agreed to keep her next visit at the agreed upon time to discuss these results.  Objective:   Blood pressure 129/80, pulse 87, temperature 97.7 F (36.5 C), temperature source Oral, height 5\' 2"  (1.575 m), weight 206 lb (93.4 kg), last menstrual period 01/22/2020, SpO2 98 %. Body mass index is 37.68 kg/m.  General: Cooperative, alert, well developed, in no acute distress. HEENT: Conjunctivae and lids unremarkable. Cardiovascular: Regular rhythm.  Lungs: Normal work of breathing. Neurologic: No focal deficits.   Lab Results  Component Value Date   CREATININE 0.70 10/02/2019   BUN 12 10/02/2019   NA 139 10/02/2019   K 4.6 10/02/2019   CL 105 10/02/2019   CO2 21 10/02/2019   Lab Results  Component Value Date   ALT 15 10/02/2019   AST 12 10/02/2019   ALKPHOS 59 10/02/2019   BILITOT 0.3 10/02/2019   Lab Results  Component Value Date   HGBA1C 5.1 02/05/2020   HGBA1C 5.0 10/02/2019   HGBA1C 5.2 06/21/2019   HGBA1C 5.3 05/24/2019   Lab Results  Component Value Date   INSULIN 25.2 (H) 02/05/2020   INSULIN 34.3 (H) 10/02/2019   INSULIN 31.7 (H) 06/21/2019   INSULIN CANCELED 05/24/2019   Lab Results  Component Value Date   TSH 3.020 06/21/2019   Lab Results  Component Value Date   CHOL 196 02/05/2020   HDL 46 02/05/2020   LDLCALC 122 (H) 02/05/2020   TRIG 156 (H) 02/05/2020   Lab Results  Component Value Date   WBC 9.7 05/24/2019   HGB 13.9 05/24/2019   HCT 42.2 05/24/2019   MCV 85 05/24/2019   No results found for: IRON, TIBC,  FERRITIN  Attestation Statements:   Reviewed by clinician on day of visit: allergies, medications, problem list, medical history, surgical history, family history, social history, and previous encounter notes.   I, Trixie Dredge, am acting as transcriptionist for April Manson, MD.  I have reviewed the above documentation for accuracy and completeness, and I agree with the above. - Ilene Qua, MD

## 2020-02-07 ENCOUNTER — Ambulatory Visit (INDEPENDENT_AMBULATORY_CARE_PROVIDER_SITE_OTHER): Payer: 59 | Admitting: Professional

## 2020-02-07 DIAGNOSIS — F4322 Adjustment disorder with anxiety: Secondary | ICD-10-CM

## 2020-02-07 DIAGNOSIS — F900 Attention-deficit hyperactivity disorder, predominantly inattentive type: Secondary | ICD-10-CM

## 2020-02-15 ENCOUNTER — Ambulatory Visit (INDEPENDENT_AMBULATORY_CARE_PROVIDER_SITE_OTHER): Payer: 59 | Admitting: Professional

## 2020-02-15 DIAGNOSIS — F900 Attention-deficit hyperactivity disorder, predominantly inattentive type: Secondary | ICD-10-CM

## 2020-02-15 DIAGNOSIS — F4322 Adjustment disorder with anxiety: Secondary | ICD-10-CM

## 2020-02-26 ENCOUNTER — Other Ambulatory Visit: Payer: Self-pay

## 2020-02-26 ENCOUNTER — Encounter (INDEPENDENT_AMBULATORY_CARE_PROVIDER_SITE_OTHER): Payer: Self-pay | Admitting: Physician Assistant

## 2020-02-26 ENCOUNTER — Ambulatory Visit (INDEPENDENT_AMBULATORY_CARE_PROVIDER_SITE_OTHER): Payer: 59 | Admitting: Physician Assistant

## 2020-02-26 VITALS — BP 126/84 | HR 80 | Temp 98.0°F | Ht 62.0 in | Wt 210.0 lb

## 2020-02-26 DIAGNOSIS — E559 Vitamin D deficiency, unspecified: Secondary | ICD-10-CM | POA: Diagnosis not present

## 2020-02-26 DIAGNOSIS — Z6838 Body mass index (BMI) 38.0-38.9, adult: Secondary | ICD-10-CM | POA: Diagnosis not present

## 2020-02-26 NOTE — Progress Notes (Signed)
Chief Complaint:   OBESITY Norma Cohen is here to discuss her progress with her obesity treatment plan along with follow-up of her obesity related diagnoses. Norma Cohen is on the Category 4 Plan and journaling dinner and states she is following her eating plan approximately 75-80% of the time. Norma Cohen states she is doing Body Groove 28-35 minutes 3 times per week.  Today's visit was #: 15 Starting weight: 218 lbs Starting date: 05/24/2019 Today's weight: 210 lbs Today's date: 02/26/2020 Total lbs lost to date: 8 Total lbs lost since last in-office visit: 0  Interim History: Norma Cohen is feeling bored with food and is not motivated. Last week was [employer] and she overindulged.   Subjective:   Vitamin D deficiency. Norma Cohen is on Vitamin D. No nausea, vomiting, or muscle weakness. Last Vitamin D 51.4 on 02/05/2020.  Assessment/Plan:   Vitamin D deficiency. Low Vitamin D level contributes to fatigue and are associated with obesity, breast, and colon cancer. She agrees to continue to take Vitamin D and will follow-up for routine testing of Vitamin D, at least 2-3 times per year to avoid over-replacement.  Class 2 severe obesity with serious comorbidity and body mass index (BMI) of 38.0 to 38.9 in adult, unspecified obesity type ([employer]).  Norma Cohen is currently in the action stage of change. As such, her goal is to continue with weight loss efforts. She has agreed to keeping a food journal and adhering to recommended goals of 1600-1800 calories and 115 grams of protein daily.   Exercise goals: For substantial health benefits, adults should do at least 150 minutes (2 hours and 30 minutes) a week of moderate-intensity, or 75 minutes (1 hour and 15 minutes) a week of vigorous-intensity aerobic physical activity, or an equivalent combination of moderate- and vigorous-intensity aerobic activity. Aerobic activity should be performed in episodes of at least 10 minutes, and preferably, it  should be spread throughout the week.  Behavioral modification strategies: meal planning and cooking strategies and keeping healthy foods in the home.  Norma Cohen has agreed to follow-up with our clinic in 3 weeks. She was informed of the importance of frequent follow-up visits to maximize her success with intensive lifestyle modifications for her multiple health conditions.   Objective:   Blood pressure 126/84, pulse 80, temperature 98 F (36.7 C), temperature source Oral, height 5\' 2"  (1.575 m), weight 210 lb (95.3 kg), SpO2 98 %. Body mass index is 38.41 kg/m.  General: Cooperative, alert, well developed, in no acute distress. HEENT: Conjunctivae and lids unremarkable. Cardiovascular: Regular rhythm.  Lungs: Normal work of breathing. Neurologic: No focal deficits.   Lab Results  Component Value Date   CREATININE 0.70 10/02/2019   BUN 12 10/02/2019   NA 139 10/02/2019   K 4.6 10/02/2019   CL 105 10/02/2019   CO2 21 10/02/2019   Lab Results  Component Value Date   ALT 15 10/02/2019   AST 12 10/02/2019   ALKPHOS 59 10/02/2019   BILITOT 0.3 10/02/2019   Lab Results  Component Value Date   HGBA1C 5.1 02/05/2020   HGBA1C 5.0 10/02/2019   HGBA1C 5.2 06/21/2019   HGBA1C 5.3 05/24/2019   Lab Results  Component Value Date   INSULIN 25.2 (H) 02/05/2020   INSULIN 34.3 (H) 10/02/2019   INSULIN 31.7 (H) 06/21/2019   INSULIN CANCELED 05/24/2019   Lab Results  Component Value Date   TSH 3.020 06/21/2019   Lab Results  Component Value Date   CHOL 196 02/05/2020  HDL 46 02/05/2020   LDLCALC 122 (H) 02/05/2020   TRIG 156 (H) 02/05/2020   Lab Results  Component Value Date   WBC 9.7 05/24/2019   HGB 13.9 05/24/2019   HCT 42.2 05/24/2019   MCV 85 05/24/2019   No results found for: IRON, TIBC, FERRITIN  Attestation Statements:   Reviewed by clinician on day of visit: allergies, medications, problem list, medical history, surgical history, family history, social history,  and previous encounter notes.  Time spent on visit including pre-visit chart review and post-visit charting and care was 30 minutes.   IMarianna Payment, am acting as transcriptionist for Alois Cliche, PA-C   I have reviewed the above documentation for accuracy and completeness, and I agree with the above. Alois Cliche, PA-C

## 2020-02-27 ENCOUNTER — Ambulatory Visit (INDEPENDENT_AMBULATORY_CARE_PROVIDER_SITE_OTHER): Payer: 59 | Admitting: Professional

## 2020-02-27 DIAGNOSIS — F4322 Adjustment disorder with anxiety: Secondary | ICD-10-CM

## 2020-02-27 DIAGNOSIS — F900 Attention-deficit hyperactivity disorder, predominantly inattentive type: Secondary | ICD-10-CM | POA: Diagnosis not present

## 2020-03-03 ENCOUNTER — Other Ambulatory Visit (INDEPENDENT_AMBULATORY_CARE_PROVIDER_SITE_OTHER): Payer: Self-pay | Admitting: Family Medicine

## 2020-03-03 DIAGNOSIS — E559 Vitamin D deficiency, unspecified: Secondary | ICD-10-CM

## 2020-03-14 ENCOUNTER — Ambulatory Visit: Payer: 59 | Admitting: Professional

## 2020-03-19 ENCOUNTER — Ambulatory Visit (INDEPENDENT_AMBULATORY_CARE_PROVIDER_SITE_OTHER): Payer: 59 | Admitting: Professional

## 2020-03-19 DIAGNOSIS — F900 Attention-deficit hyperactivity disorder, predominantly inattentive type: Secondary | ICD-10-CM | POA: Diagnosis not present

## 2020-03-19 DIAGNOSIS — F4322 Adjustment disorder with anxiety: Secondary | ICD-10-CM | POA: Diagnosis not present

## 2020-03-21 ENCOUNTER — Encounter (INDEPENDENT_AMBULATORY_CARE_PROVIDER_SITE_OTHER): Payer: Self-pay | Admitting: Physician Assistant

## 2020-03-21 ENCOUNTER — Other Ambulatory Visit: Payer: Self-pay

## 2020-03-21 ENCOUNTER — Ambulatory Visit (INDEPENDENT_AMBULATORY_CARE_PROVIDER_SITE_OTHER): Payer: 59 | Admitting: Physician Assistant

## 2020-03-21 VITALS — BP 96/69 | HR 86 | Temp 98.0°F | Ht 62.0 in | Wt 207.0 lb

## 2020-03-21 DIAGNOSIS — E7849 Other hyperlipidemia: Secondary | ICD-10-CM | POA: Diagnosis not present

## 2020-03-21 DIAGNOSIS — Z9189 Other specified personal risk factors, not elsewhere classified: Secondary | ICD-10-CM

## 2020-03-21 DIAGNOSIS — E559 Vitamin D deficiency, unspecified: Secondary | ICD-10-CM

## 2020-03-21 DIAGNOSIS — Z6837 Body mass index (BMI) 37.0-37.9, adult: Secondary | ICD-10-CM

## 2020-03-21 DIAGNOSIS — E66812 Obesity, class 2: Secondary | ICD-10-CM

## 2020-03-21 MED ORDER — VITAMIN D (ERGOCALCIFEROL) 1.25 MG (50000 UNIT) PO CAPS: 50000.0000 [IU] | ORAL_CAPSULE | ORAL | 0 refills | Status: DC

## 2020-03-21 NOTE — Progress Notes (Signed)
Chief Complaint:   OBESITY Norma Cohen is here to discuss her progress with her obesity treatment plan along with follow-up of her obesity related diagnoses. Norma Cohen is on the Category 4 Plan and states she is following her eating plan approximately 85-90% of the time. Norma Cohen states she is doing cardio for 30-35 minutes 3-4 times per week.  Today's visit was #: 16 Starting weight: 218 lbs Starting date: 05/24/2019 Today's weight: 207 lbs Today's date: 03/21/2020 Total lbs lost to date: 11 lbs Total lbs lost since last in-office visit: 3 lbs  Interim History: Norma Cohen did very well with weight loss.  She states she has been cooking more often.  She is traveling to [place] next week to watch soccer.  She notes that she is starting Vyvanse in a few days.  Subjective:   1. Vitamin D deficiency Norma Cohen's Vitamin D level was 51.4 on 02/05/2020. She is currently taking prescription vitamin D 50,000 IU each week. She denies nausea, vomiting or muscle weakness.  2. Other hyperlipidemia Norma Cohen has hyperlipidemia and has been trying to improve her cholesterol levels with intensive lifestyle modification including a low saturated fat diet, exercise and weight loss. She denies any chest pain, claudication or myalgias.  She is on no medication.  Exercising regularly.  Lab Results  Component Value Date   ALT 15 10/02/2019   AST 12 10/02/2019   ALKPHOS 59 10/02/2019   BILITOT 0.3 10/02/2019   Lab Results  Component Value Date   CHOL 196 02/05/2020   HDL 46 02/05/2020   LDLCALC 122 (H) 02/05/2020   TRIG 156 (H) 02/05/2020   3. At risk for osteoporosis Norma Cohen is at higher risk of osteopenia and osteoporosis due to Vitamin D deficiency.   Assessment/Plan:   1. Vitamin D deficiency Low Vitamin D level contributes to fatigue and are associated with obesity, breast, and colon cancer. She agrees to continue to take prescription Vitamin D @50 ,000 IU every week and will follow-up for routine testing of Vitamin  D, at least 2-3 times per year to avoid over-replacement. - Vitamin D, Ergocalciferol, (DRISDOL) 1.25 MG (50000 UNIT) CAPS capsule; Take 1 capsule (50,000 Units total) by mouth every 7 (seven) days.  Dispense: 4 capsule; Refill: 0  2. Other hyperlipidemia Cardiovascular risk and specific lipid/LDL goals reviewed.  We discussed several lifestyle modifications today and Norma Cohen will continue to work on diet, exercise and weight loss efforts. Orders and follow up as documented in patient record.   Counseling Intensive lifestyle modifications are the first line treatment for this issue. . Dietary changes: Increase soluble fiber. Decrease simple carbohydrates. . Exercise changes: Moderate to vigorous-intensity aerobic activity 150 minutes per week if tolerated. . Lipid-lowering medications: see documented in medical record.  3. At risk for osteoporosis Norma Cohen was given approximately 15 minutes of osteoporosis prevention counseling today. Norma Cohen is at risk for osteopenia and osteoporosis due to her Vitamin D deficiency. She was encouraged to take her Vitamin D and follow her higher calcium diet and increase strengthening exercise to help strengthen her bones and decrease her risk of osteopenia and osteoporosis.  Repetitive spaced learning was employed today to elicit superior memory formation and behavioral change.  4. Class 2 severe obesity with serious comorbidity and body mass index (BMI) of 37.0 to 37.9 in adult, unspecified obesity type (HCC) Norma Cohen is currently in the action stage of change. As such, her goal is to continue with weight loss efforts. She has agreed to the Category 4 Plan.  Exercise goals: For substantial health benefits, adults should do at least 150 minutes (2 hours and 30 minutes) a week of moderate-intensity, or 75 minutes (1 hour and 15 minutes) a week of vigorous-intensity aerobic physical activity, or an equivalent combination of moderate- and vigorous-intensity aerobic  activity. Aerobic activity should be performed in episodes of at least 10 minutes, and preferably, it should be spread throughout the week.  Behavioral modification strategies: meal planning and cooking strategies and keeping healthy foods in the home.  Norma Cohen has agreed to follow-up with our clinic in 3 weeks. She was informed of the importance of frequent follow-up visits to maximize her success with intensive lifestyle modifications for her multiple health conditions.   Objective:   Blood pressure 96/69, pulse 86, temperature 98 F (36.7 C), temperature source Oral, height 5\' 2"  (1.575 m), weight 207 lb (93.9 kg), SpO2 98 %. Body mass index is 37.86 kg/m.  General: Cooperative, alert, well developed, in no acute distress. HEENT: Conjunctivae and lids unremarkable. Cardiovascular: Regular rhythm.  Lungs: Normal work of breathing. Neurologic: No focal deficits.   Lab Results  Component Value Date   CREATININE 0.70 10/02/2019   BUN 12 10/02/2019   NA 139 10/02/2019   K 4.6 10/02/2019   CL 105 10/02/2019   CO2 21 10/02/2019   Lab Results  Component Value Date   ALT 15 10/02/2019   AST 12 10/02/2019   ALKPHOS 59 10/02/2019   BILITOT 0.3 10/02/2019   Lab Results  Component Value Date   HGBA1C 5.1 02/05/2020   HGBA1C 5.0 10/02/2019   HGBA1C 5.2 06/21/2019   HGBA1C 5.3 05/24/2019   Lab Results  Component Value Date   INSULIN 25.2 (H) 02/05/2020   INSULIN 34.3 (H) 10/02/2019   INSULIN 31.7 (H) 06/21/2019   INSULIN CANCELED 05/24/2019   Lab Results  Component Value Date   TSH 3.020 06/21/2019   Lab Results  Component Value Date   CHOL 196 02/05/2020   HDL 46 02/05/2020   LDLCALC 122 (H) 02/05/2020   TRIG 156 (H) 02/05/2020   Lab Results  Component Value Date   WBC 9.7 05/24/2019   HGB 13.9 05/24/2019   HCT 42.2 05/24/2019   MCV 85 05/24/2019   Attestation Statements:   Reviewed by clinician on day of visit: allergies, medications, problem list, medical  history, surgical history, family history, social history, and previous encounter notes.  I, Water quality scientist, CMA, am acting as transcriptionist for Abby Potash, PA-C  I have reviewed the above documentation for accuracy and completeness, and I agree with the above. Abby Potash, PA-C

## 2020-04-03 ENCOUNTER — Ambulatory Visit: Payer: 59 | Admitting: Professional

## 2020-04-11 ENCOUNTER — Ambulatory Visit (INDEPENDENT_AMBULATORY_CARE_PROVIDER_SITE_OTHER): Payer: 59 | Admitting: Physician Assistant

## 2020-04-14 ENCOUNTER — Other Ambulatory Visit (INDEPENDENT_AMBULATORY_CARE_PROVIDER_SITE_OTHER): Payer: Self-pay | Admitting: Physician Assistant

## 2020-04-14 DIAGNOSIS — E559 Vitamin D deficiency, unspecified: Secondary | ICD-10-CM

## 2020-04-15 ENCOUNTER — Ambulatory Visit (INDEPENDENT_AMBULATORY_CARE_PROVIDER_SITE_OTHER): Payer: 59 | Admitting: Physician Assistant

## 2020-04-15 ENCOUNTER — Other Ambulatory Visit: Payer: Self-pay

## 2020-04-15 ENCOUNTER — Encounter (INDEPENDENT_AMBULATORY_CARE_PROVIDER_SITE_OTHER): Payer: Self-pay | Admitting: Physician Assistant

## 2020-04-15 VITALS — BP 146/84 | HR 82 | Temp 97.7°F | Ht 62.0 in | Wt 210.0 lb

## 2020-04-15 DIAGNOSIS — E559 Vitamin D deficiency, unspecified: Secondary | ICD-10-CM | POA: Diagnosis not present

## 2020-04-15 DIAGNOSIS — E7849 Other hyperlipidemia: Secondary | ICD-10-CM

## 2020-04-15 DIAGNOSIS — Z9189 Other specified personal risk factors, not elsewhere classified: Secondary | ICD-10-CM

## 2020-04-15 DIAGNOSIS — Z6838 Body mass index (BMI) 38.0-38.9, adult: Secondary | ICD-10-CM

## 2020-04-15 NOTE — Progress Notes (Signed)
Chief Complaint:   OBESITY Norma Cohen is here to discuss her progress with her obesity treatment plan along with follow-up of her obesity related diagnoses. Norma Cohen is on the Category 4 Plan and states she is following her eating plan approximately 75-80% of the time. Norma Cohen states she is doing Body Groove 30 minutes 3 times per week.  Today's visit was #: 52 Starting weight: 218 lbs Starting date: 05/24/2019 Today's weight: 210 lbs Today's date: 04/15/2020 Total lbs lost to date: 8 Total lbs lost since last in-office visit: 0  Interim History: Norma Cohen reports that she was sick with an upper respiratory infection last week and only ate soup. She is undereating her protein.  Subjective:   Vitamin D deficiency. Norma Cohen is on prescription Vitamin D weekly. No nausea, vomiting, or muscle weakness.    Ref. Range 02/05/2020 12:57  Vitamin D, 25-Hydroxy Latest Ref Range: 30.0 - 100.0 ng/mL 51.4   Other hyperlipidemia. Norma Cohen has hyperlipidemia and has been trying to improve her cholesterol levels with intensive lifestyle modification including a low saturated fat diet, exercise and weight loss. She denies any chest pain. Norma Cohen is on no medication. She is exercising regularly.  Lab Results  Component Value Date   ALT 15 10/02/2019   AST 12 10/02/2019   ALKPHOS 59 10/02/2019   BILITOT 0.3 10/02/2019   Lab Results  Component Value Date   CHOL 196 02/05/2020   HDL 46 02/05/2020   LDLCALC 122 (H) 02/05/2020   TRIG 156 (H) 02/05/2020   At risk for heart disease. Norma Cohen is at a higher than average risk for cardiovascular disease due to obesity.   Assessment/Plan:   Vitamin D deficiency. Low Vitamin D level contributes to fatigue and are associated with obesity, breast, and colon cancer. She was given a refill on her Vitamin D, Ergocalciferol, (DRISDOL) 1.25 MG (50000 UNIT) CAPS capsule every week #4 with 0 refills and will follow-up for routine testing of Vitamin D, at least 2-3  times per year to avoid over-replacement.   Other hyperlipidemia. Cardiovascular risk and specific lipid/LDL goals reviewed.  We discussed several lifestyle modifications today and Norma Cohen will continue to work on diet, exercise and weight loss efforts. Orders and follow up as documented in patient record.   Counseling Intensive lifestyle modifications are the first line treatment for this issue. . Dietary changes: Increase soluble fiber. Decrease simple carbohydrates. . Exercise changes: Moderate to vigorous-intensity aerobic activity 150 minutes per week if tolerated. . Lipid-lowering medications: see documented in medical record.  At risk for heart disease. Norma Cohen was given approximately 15 minutes of coronary artery disease prevention counseling today. She is 29 y.o. female and has risk factors for heart disease including obesity. We discussed intensive lifestyle modifications today with an emphasis on specific weight loss instructions and strategies.   Repetitive spaced learning was employed today to elicit superior memory formation and behavioral change.  Class 2 severe obesity with serious comorbidity and body mass index (BMI) of 38.0 to 38.9 in adult, unspecified obesity type (Norma Cohen).  Norma Cohen is currently in the action stage of change. As such, her goal is to continue with weight loss efforts. She has agreed to the Category 4 Plan.   Exercise goals: For substantial health benefits, adults should do at least 150 minutes (2 hours and 30 minutes) a week of moderate-intensity, or 75 minutes (1 hour and 15 minutes) a week of vigorous-intensity aerobic physical activity, or an equivalent combination of moderate- and vigorous-intensity aerobic activity.  Aerobic activity should be performed in episodes of at least 10 minutes, and preferably, it should be spread throughout the week.  Behavioral modification strategies: meal planning and cooking strategies and keeping healthy foods in the home.  Norma Cohen  has agreed to follow-up with our clinic in 2 weeks. She was informed of the importance of frequent follow-up visits to maximize her success with intensive lifestyle modifications for her multiple health conditions.   Objective:   Blood pressure (!) 146/84, pulse 82, temperature 97.7 F (36.5 C), temperature source Oral, height 5\' 2"  (1.575 m), weight 210 lb (95.3 kg), last menstrual period 04/10/2020, SpO2 100 %. Body mass index is 38.41 kg/m.  General: Cooperative, alert, well developed, in no acute distress. HEENT: Conjunctivae and lids unremarkable. Cardiovascular: Regular rhythm.  Lungs: Normal work of breathing. Neurologic: No focal deficits.   Lab Results  Component Value Date   CREATININE 0.70 10/02/2019   BUN 12 10/02/2019   NA 139 10/02/2019   K 4.6 10/02/2019   CL 105 10/02/2019   CO2 21 10/02/2019   Lab Results  Component Value Date   ALT 15 10/02/2019   AST 12 10/02/2019   ALKPHOS 59 10/02/2019   BILITOT 0.3 10/02/2019   Lab Results  Component Value Date   HGBA1C 5.1 02/05/2020   HGBA1C 5.0 10/02/2019   HGBA1C 5.2 06/21/2019   HGBA1C 5.3 05/24/2019   Lab Results  Component Value Date   INSULIN 25.2 (H) 02/05/2020   INSULIN 34.3 (H) 10/02/2019   INSULIN 31.7 (H) 06/21/2019   INSULIN CANCELED 05/24/2019   Lab Results  Component Value Date   TSH 3.020 06/21/2019   Lab Results  Component Value Date   CHOL 196 02/05/2020   HDL 46 02/05/2020   LDLCALC 122 (H) 02/05/2020   TRIG 156 (H) 02/05/2020   Lab Results  Component Value Date   WBC 9.7 05/24/2019   HGB 13.9 05/24/2019   HCT 42.2 05/24/2019   MCV 85 05/24/2019   No results found for: IRON, TIBC, FERRITIN  Attestation Statements:   Reviewed by clinician on day of visit: allergies, medications, problem list, medical history, surgical history, family history, social history, and previous encounter notes.  I10/02/2019, am acting as transcriptionist for Marianna Payment, PA-C   I have  reviewed the above documentation for accuracy and completeness, and I agree with the above. Alois Cliche, PA-C

## 2020-04-16 MED ORDER — VITAMIN D (ERGOCALCIFEROL) 1.25 MG (50000 UNIT) PO CAPS: 50000.0000 [IU] | ORAL_CAPSULE | ORAL | 0 refills | Status: DC

## 2020-04-24 ENCOUNTER — Ambulatory Visit: Payer: 59 | Admitting: Professional

## 2020-05-02 ENCOUNTER — Ambulatory Visit (INDEPENDENT_AMBULATORY_CARE_PROVIDER_SITE_OTHER): Payer: 59 | Admitting: Family Medicine

## 2020-05-02 ENCOUNTER — Encounter (INDEPENDENT_AMBULATORY_CARE_PROVIDER_SITE_OTHER): Payer: Self-pay | Admitting: Family Medicine

## 2020-05-02 ENCOUNTER — Other Ambulatory Visit: Payer: Self-pay

## 2020-05-02 VITALS — BP 128/94 | HR 91 | Temp 98.0°F | Ht 62.0 in | Wt 211.0 lb

## 2020-05-02 DIAGNOSIS — E8881 Metabolic syndrome: Secondary | ICD-10-CM

## 2020-05-02 DIAGNOSIS — Z9189 Other specified personal risk factors, not elsewhere classified: Secondary | ICD-10-CM | POA: Diagnosis not present

## 2020-05-02 DIAGNOSIS — E7849 Other hyperlipidemia: Secondary | ICD-10-CM

## 2020-05-02 DIAGNOSIS — Z6838 Body mass index (BMI) 38.0-38.9, adult: Secondary | ICD-10-CM

## 2020-05-02 MED ORDER — METFORMIN HCL 500 MG PO TABS: 500.0000 mg | ORAL_TABLET | Freq: Every day | ORAL | 0 refills | Status: DC

## 2020-05-06 NOTE — Progress Notes (Signed)
Chief Complaint:   OBESITY Norma Cohen is here to discuss her progress with her obesity treatment plan along with follow-up of her obesity related diagnoses. Norma Cohen is on the Category 4 Plan and states she is following her eating plan approximately 70% of the time. Norma Cohen states she is doing Body groove 25-35 minutes 3 times per week.  Today's visit was #: 18 Starting weight: 218 lbs Starting date: 05/24/2019 Today's weight: 211 lbs Today's date: 05/02/2020 Total lbs lost to date: 7 lbs Total lbs lost since last in-office visit: 0  Interim History: Norma Cohen went on a very last minute less than planned vacation which did make following the plan difficult. She has been planning meals prior to going to the grocery store. She denies decrease in appetite on Vyvanse.   Subjective:   1. Insulin resistance Norma Cohen has a diagnosis of insulin resistance based on her elevated fasting insulin level >5. She continues to work on diet and exercise to decrease her risk of diabetes. She has no history of medication treatment.   Lab Results  Component Value Date   INSULIN 25.2 (H) 02/05/2020   INSULIN 34.3 (H) 10/02/2019   INSULIN 31.7 (H) 06/21/2019   INSULIN CANCELED 05/24/2019   Lab Results  Component Value Date   HGBA1C 5.1 02/05/2020    2. Other hyperlipidemia Norma Cohen has hyperlipidemia and has been trying to improve her cholesterol levels with intensive lifestyle modification including a low saturated fat diet, exercise and weight loss. She denies any chest pain, claudication or myalgias. She is not currently taking medications.   Lab Results  Component Value Date   ALT 15 10/02/2019   AST 12 10/02/2019   ALKPHOS 59 10/02/2019   BILITOT 0.3 10/02/2019   Lab Results  Component Value Date   CHOL 196 02/05/2020   HDL 46 02/05/2020   LDLCALC 122 (H) 02/05/2020   TRIG 156 (H) 02/05/2020    3. At risk for diabetes mellitus Norma Cohen is at higher than average risk for developing diabetes due to her  obesity.    Assessment/Plan:   1. Insulin resistance Norma Cohen will continue to work on weight loss, exercise, and decreasing simple carbohydrates to help decrease the risk of diabetes. Norma Cohen agreed to follow-up with [place] as directed to closely monitor her progress. Start metFORMIN (GLUCOPHAGE) 500 MG tablet; Take 1 tablet (500 mg total) by mouth daily with breakfast.  Dispense: 30 tablet; Refill: 0  2. Other hyperlipidemia Cardiovascular risk and specific lipid/LDL goals reviewed.  We discussed several lifestyle modifications today and Norma Cohen will continue to work on diet, exercise and weight loss efforts. Orders and follow up as documented in patient record. We will obtain labs in late August.   Counseling Intensive lifestyle modifications are the first line treatment for this issue. . Dietary changes: Increase soluble fiber. Decrease simple carbohydrates. . Exercise changes: Moderate to vigorous-intensity aerobic activity 150 minutes per week if tolerated. . Lipid-lowering medications: see documented in medical record.  3. At risk for diabetes mellitus Norma Cohen was given approximately 15 minutes of diabetes education and counseling today. We discussed intensive lifestyle modifications today with an emphasis on weight loss as well as increasing exercise and decreasing simple carbohydrates in her diet. We also reviewed medication options with an emphasis on risk versus benefit of those discussed.   Repetitive spaced learning was employed today to elicit superior memory formation and behavioral change.  4. Class 2 severe obesity with serious comorbidity and body mass index (BMI) of 38.0 to  38.9 in adult, unspecified obesity type (HCC) Norma Cohen is currently in the action stage of change. As such, her goal is to continue with weight loss efforts. She has agreed to the Category 4 Plan.   Exercise goals: Continue as above.   Behavioral modification strategies: increasing lean protein intake, meal planning  and cooking strategies and keeping healthy foods in the home.  Norma Cohen has agreed to follow-up with our clinic in 2-3 weeks. She was informed of the importance of frequent follow-up visits to maximize her success with intensive lifestyle modifications for her multiple health conditions.    Objective:   Blood pressure (!) 128/94, pulse 91, temperature 98 F (36.7 C), temperature source Oral, height 5\' 2"  (1.575 m), weight 211 lb (95.7 kg), last menstrual period 04/10/2020, SpO2 98 %. Body mass index is 38.59 kg/m.  General: Cooperative, alert, well developed, in no acute distress. HEENT: Conjunctivae and lids unremarkable. Cardiovascular: Regular rhythm.  Lungs: Normal work of breathing. Neurologic: No focal deficits.   Lab Results  Component Value Date   CREATININE 0.70 10/02/2019   BUN 12 10/02/2019   NA 139 10/02/2019   K 4.6 10/02/2019   CL 105 10/02/2019   CO2 21 10/02/2019   Lab Results  Component Value Date   ALT 15 10/02/2019   AST 12 10/02/2019   ALKPHOS 59 10/02/2019   BILITOT 0.3 10/02/2019   Lab Results  Component Value Date   HGBA1C 5.1 02/05/2020   HGBA1C 5.0 10/02/2019   HGBA1C 5.2 06/21/2019   HGBA1C 5.3 05/24/2019   Lab Results  Component Value Date   INSULIN 25.2 (H) 02/05/2020   INSULIN 34.3 (H) 10/02/2019   INSULIN 31.7 (H) 06/21/2019   INSULIN CANCELED 05/24/2019   Lab Results  Component Value Date   TSH 3.020 06/21/2019   Lab Results  Component Value Date   CHOL 196 02/05/2020   HDL 46 02/05/2020   LDLCALC 122 (H) 02/05/2020   TRIG 156 (H) 02/05/2020   Lab Results  Component Value Date   WBC 9.7 05/24/2019   HGB 13.9 05/24/2019   HCT 42.2 05/24/2019   MCV 85 05/24/2019   No results found for: IRON, TIBC, FERRITIN  Obesity Behavioral Intervention Documentation for Insurance:   Approximately 15 minutes were spent on the discussion below.  ASK: We discussed the diagnosis of obesity with Norma Cohen today and Norma Cohen agreed to give 07/24/2019  permission to discuss obesity behavioral modification therapy today.  ASSESS: Norma Cohen has the diagnosis of obesity and her BMI today is 38.58. Norma Cohen is in the action stage of change.   ADVISE: Meyah was educated on the multiple health risks of obesity as well as the benefit of weight loss to improve her health. She was advised of the need for long term treatment and the importance of lifestyle modifications to improve her current health and to decrease her risk of future health problems.  AGREE: Multiple dietary modification options and treatment options were discussed and Oral agreed to follow the recommendations documented in the above note.  ARRANGE: Alyvia was educated on the importance of frequent visits to treat obesity as outlined per CMS and USPSTF guidelines and agreed to schedule her next follow up appointment today.  Attestation Statements:   Reviewed by clinician on day of visit: allergies, medications, problem list, medical history, surgical history, family history, social history, and previous encounter notes.   I, Wilder Glade, am acting as transcriptionist for Jeralene Peters, MD .  I have reviewed the above documentation for  accuracy and completeness, and I agree with the above. - Jinny Blossom, MD

## 2020-05-07 ENCOUNTER — Ambulatory Visit: Payer: 59 | Admitting: Professional

## 2020-05-16 ENCOUNTER — Encounter (INDEPENDENT_AMBULATORY_CARE_PROVIDER_SITE_OTHER): Payer: Self-pay | Admitting: Physician Assistant

## 2020-05-16 ENCOUNTER — Ambulatory Visit (INDEPENDENT_AMBULATORY_CARE_PROVIDER_SITE_OTHER): Payer: 59 | Admitting: Physician Assistant

## 2020-05-16 ENCOUNTER — Other Ambulatory Visit: Payer: Self-pay

## 2020-05-16 VITALS — BP 141/83 | HR 84 | Temp 98.1°F | Ht 62.0 in | Wt 208.0 lb

## 2020-05-16 DIAGNOSIS — Z6838 Body mass index (BMI) 38.0-38.9, adult: Secondary | ICD-10-CM | POA: Diagnosis not present

## 2020-05-16 DIAGNOSIS — E559 Vitamin D deficiency, unspecified: Secondary | ICD-10-CM | POA: Diagnosis not present

## 2020-05-16 DIAGNOSIS — E8881 Metabolic syndrome: Secondary | ICD-10-CM

## 2020-05-19 ENCOUNTER — Other Ambulatory Visit (INDEPENDENT_AMBULATORY_CARE_PROVIDER_SITE_OTHER): Payer: Self-pay | Admitting: Physician Assistant

## 2020-05-19 DIAGNOSIS — E559 Vitamin D deficiency, unspecified: Secondary | ICD-10-CM

## 2020-05-20 NOTE — Progress Notes (Signed)
Chief Complaint:   OBESITY Pete Merten is here to discuss her progress with her obesity treatment plan along with follow-up of her obesity related diagnoses. Dayne is on the Category 4 Plan and states she is following her eating plan approximately 80% of the time. Lenisha states she is doing Body Groove 30+ minutes 3 times per week.  Today's visit was #: 19 Starting weight: 218 lbs Starting date: 05/24/2019 Today's weight: 208 lbs Today's date: 05/16/2020 Total lbs lost to date: 10 Total lbs lost since last in-office visit: 3  Interim History: Emira states that the metformin has been making her nauseous and giving her some headaches. She is traveling to [place] next week.  Subjective:   Insulin resistance. Niurka has a diagnosis of insulin resistance based on her elevated fasting insulin level >5. She continues to work on diet and exercise to decrease her risk of diabetes. Jacquilyn is on metformin, which is causing some nausea; diarrhea has stopped.  Lab Results  Component Value Date   INSULIN 25.2 (H) 02/05/2020   INSULIN 34.3 (H) 10/02/2019   INSULIN 31.7 (H) 06/21/2019   INSULIN CANCELED 05/24/2019   Lab Results  Component Value Date   HGBA1C 5.1 02/05/2020   Vitamin D deficiency. Mykira is on weekly Vitamin D, which she is tolerating well.   Ref. Range 02/05/2020 12:57  Vitamin D, 25-Hydroxy Latest Ref Range: 30.0 - 100.0 ng/mL 51.4   Assessment/Plan:   Insulin resistance. Jayliana will continue to work on weight loss, exercise, and decreasing simple carbohydrates to help decrease the risk of diabetes. Ozie agreed to follow-up with [place] as directed to closely monitor her progress. She was instructed to take metformin at night. We discussed metformin XR if needed.  Vitamin D deficiency. Low Vitamin D level contributes to fatigue and are associated with obesity, breast, and colon cancer. She agrees to continue to take Vitamin D as directed and will follow-up for routine  testing of Vitamin D, at least 2-3 times per year to avoid over-replacement.  Class 2 severe obesity with serious comorbidity and body mass index (BMI) of 38.0 to 38.9 in adult, unspecified obesity type (HCC).  Tashiba is currently in the action stage of change. As such, her goal is to continue with weight loss efforts. She has agreed to the Category 4 Plan.   She was advised to take her metformin at night.  Exercise goals: For substantial health benefits, adults should do at least 150 minutes (2 hours and 30 minutes) a week of moderate-intensity, or 75 minutes (1 hour and 15 minutes) a week of vigorous-intensity aerobic physical activity, or an equivalent combination of moderate- and vigorous-intensity aerobic activity. Aerobic activity should be performed in episodes of at least 10 minutes, and preferably, it should be spread throughout the week.  Behavioral modification strategies: meal planning and cooking strategies and keeping healthy foods in the home.  Melea has agreed to follow-up with our clinic in 2 weeks. She was informed of the importance of frequent follow-up visits to maximize her success with intensive lifestyle modifications for her multiple health conditions.   Objective:   Blood pressure (!) 141/83, pulse 84, temperature 98.1 F (36.7 C), temperature source Oral, height 5\' 2"  (1.575 m), weight (!) 208 lb (94.3 kg), SpO2 98 %. Body mass index is 38.04 kg/m.  General: Cooperative, alert, well developed, in no acute distress. HEENT: Conjunctivae and lids unremarkable. Cardiovascular: Regular rhythm.  Lungs: Normal work of breathing. Neurologic: No focal deficits.  Lab Results  Component Value Date   CREATININE 0.70 10/02/2019   BUN 12 10/02/2019   NA 139 10/02/2019   K 4.6 10/02/2019   CL 105 10/02/2019   CO2 21 10/02/2019   Lab Results  Component Value Date   ALT 15 10/02/2019   AST 12 10/02/2019   ALKPHOS 59 10/02/2019   BILITOT 0.3 10/02/2019   Lab Results   Component Value Date   HGBA1C 5.1 02/05/2020   HGBA1C 5.0 10/02/2019   HGBA1C 5.2 06/21/2019   HGBA1C 5.3 05/24/2019   Lab Results  Component Value Date   INSULIN 25.2 (H) 02/05/2020   INSULIN 34.3 (H) 10/02/2019   INSULIN 31.7 (H) 06/21/2019   INSULIN CANCELED 05/24/2019   Lab Results  Component Value Date   TSH 3.020 06/21/2019   Lab Results  Component Value Date   CHOL 196 02/05/2020   HDL 46 02/05/2020   LDLCALC 122 (H) 02/05/2020   TRIG 156 (H) 02/05/2020   Lab Results  Component Value Date   WBC 9.7 05/24/2019   HGB 13.9 05/24/2019   HCT 42.2 05/24/2019   MCV 85 05/24/2019   No results found for: IRON, TIBC, FERRITIN  Attestation Statements:   Reviewed by clinician on day of visit: allergies, medications, problem list, medical history, surgical history, family history, social history, and previous encounter notes.  Time spent on visit including pre-visit chart review and post-visit charting and care was 31 minutes.   IMarianna Payment, am acting as transcriptionist for Alois Cliche, PA-C   I have reviewed the above documentation for accuracy and completeness, and I agree with the above. Alois Cliche, PA-C

## 2020-05-28 ENCOUNTER — Other Ambulatory Visit (INDEPENDENT_AMBULATORY_CARE_PROVIDER_SITE_OTHER): Payer: Self-pay | Admitting: Family Medicine

## 2020-05-28 DIAGNOSIS — E8881 Metabolic syndrome: Secondary | ICD-10-CM

## 2020-05-30 ENCOUNTER — Ambulatory Visit (INDEPENDENT_AMBULATORY_CARE_PROVIDER_SITE_OTHER): Payer: 59 | Admitting: Bariatrics

## 2020-05-30 ENCOUNTER — Ambulatory Visit (INDEPENDENT_AMBULATORY_CARE_PROVIDER_SITE_OTHER): Payer: 59 | Admitting: Physician Assistant

## 2020-05-30 ENCOUNTER — Encounter (INDEPENDENT_AMBULATORY_CARE_PROVIDER_SITE_OTHER): Payer: Self-pay | Admitting: Bariatrics

## 2020-05-30 ENCOUNTER — Other Ambulatory Visit: Payer: Self-pay

## 2020-05-30 VITALS — BP 134/82 | HR 80 | Temp 98.0°F | Ht 62.0 in | Wt 208.0 lb

## 2020-05-30 DIAGNOSIS — E8881 Metabolic syndrome: Secondary | ICD-10-CM | POA: Diagnosis not present

## 2020-05-30 DIAGNOSIS — Z9189 Other specified personal risk factors, not elsewhere classified: Secondary | ICD-10-CM | POA: Diagnosis not present

## 2020-05-30 DIAGNOSIS — E6609 Other obesity due to excess calories: Secondary | ICD-10-CM

## 2020-05-30 DIAGNOSIS — E7849 Other hyperlipidemia: Secondary | ICD-10-CM

## 2020-05-30 DIAGNOSIS — Z6838 Body mass index (BMI) 38.0-38.9, adult: Secondary | ICD-10-CM

## 2020-05-30 MED ORDER — METFORMIN HCL 500 MG PO TABS: 500.0000 mg | ORAL_TABLET | Freq: Every day | ORAL | 0 refills | Status: DC

## 2020-06-04 ENCOUNTER — Encounter (INDEPENDENT_AMBULATORY_CARE_PROVIDER_SITE_OTHER): Payer: Self-pay | Admitting: Bariatrics

## 2020-06-04 DIAGNOSIS — Z6839 Body mass index (BMI) 39.0-39.9, adult: Secondary | ICD-10-CM | POA: Insufficient documentation

## 2020-06-04 DIAGNOSIS — E8881 Metabolic syndrome: Secondary | ICD-10-CM | POA: Insufficient documentation

## 2020-06-04 DIAGNOSIS — E6609 Other obesity due to excess calories: Secondary | ICD-10-CM | POA: Insufficient documentation

## 2020-06-04 NOTE — Progress Notes (Signed)
Chief Complaint:   OBESITY Norma Cohen is here to discuss her progress with her obesity treatment plan along with follow-up of her obesity related diagnoses. Norma Cohen is on the Category 4 Plan and states she is following her eating plan approximately 85% of the time. Norma Cohen states she is doing Body Groove 30 minutes 3 times per week.  Today's visit was #: 20 Starting weight: 218 lbs Starting date: 05/24/2019 Today's weight: 208 lbs Today's date: 05/30/2020 Total lbs lost to date: 10 Total lbs lost since last in-office visit: 0  Interim History: Norma Cohen's weight remains the same.  Subjective:   Insulin resistance. Norma Cohen has a diagnosis of insulin resistance based on her elevated fasting insulin level >5. She continues to work on diet and exercise to decrease her risk of diabetes. Norma Cohen reports metformin also helps her cravings. No polyphagia.  Lab Results  Component Value Date   INSULIN 25.2 (H) 02/05/2020   INSULIN 34.3 (H) 10/02/2019   INSULIN 31.7 (H) 06/21/2019   INSULIN CANCELED 05/24/2019   Lab Results  Component Value Date   HGBA1C 5.1 02/05/2020   Other hyperlipidemia. Norma Cohen has hyperlipidemia and has been trying to improve her cholesterol levels with intensive lifestyle modification including a low saturated fat diet, exercise and weight loss. She denies any chest pain, claudication or myalgias. Norma Cohen is on no medication.  Lab Results  Component Value Date   ALT 15 10/02/2019   AST 12 10/02/2019   ALKPHOS 59 10/02/2019   BILITOT 0.3 10/02/2019   Lab Results  Component Value Date   CHOL 196 02/05/2020   HDL 46 02/05/2020   LDLCALC 122 (H) 02/05/2020   TRIG 156 (H) 02/05/2020   At risk for osteoporosis. Norma Cohen is at higher risk of osteopenia and osteoporosis due to Vitamin D deficiency.   Assessment/Plan:   Insulin resistance. Norma Cohen will continue to work on weight loss, exercise, and decreasing simple carbohydrates to help decrease the risk of diabetes.  Norma Cohen agreed to follow-up with Norma Cohen as directed to closely monitor her progress. Prescription was given for metFORMIN (GLUCOPHAGE) 500 MG tablet 1 PO daily #30 with 0 refills.  Other hyperlipidemia. Cardiovascular risk and specific lipid/LDL goals reviewed.  We discussed several lifestyle modifications today and Olita will continue to work on diet, exercise and weight loss efforts. Orders and follow up as documented in patient record. Norma Cohen will increase PUFA's and MUFA's and will avoid trans fats in her diet.  Counseling Intensive lifestyle modifications are the first line treatment for this issue. . Dietary changes: Increase soluble fiber. Decrease simple carbohydrates. . Exercise changes: Moderate to vigorous-intensity aerobic activity 150 minutes per week if tolerated. . Lipid-lowering medications: see documented in medical record.  At risk for osteoporosis. Norma Cohen was given approximately 15 minutes of osteoporosis prevention counseling today. Norma Cohen is at risk for osteopenia and osteoporosis due to her Vitamin D deficiency. She was encouraged to take her Vitamin D and follow her higher calcium diet and increase strengthening exercise to help strengthen her bones and decrease her risk of osteopenia and osteoporosis.  Repetitive spaced learning was employed today to elicit superior memory formation and behavioral change.  Class 2 severe obesity with serious comorbidity and body mass index (BMI) of 38.0 to 38.9 in adult, unspecified obesity type (HCC).  Norma Cohen is currently in the action stage of change. As such, her goal is to continue with weight loss efforts. She has agreed to the Category 4 Plan.   She will work on  meal planning, intentional eating, and increasing her water and protein intake. She will eat snacks.  Exercise goals: Norma Cohen is doing a Body Groove class (yoga, intense training, multiple exercises).  Behavioral modification strategies: increasing lean protein intake, decreasing  simple carbohydrates, increasing vegetables, increasing water intake, decreasing eating out, no skipping meals, meal planning and cooking strategies, keeping healthy foods in the home and planning for success.  Norma Cohen has agreed to follow-up with our clinic fasting in 2-3 weeks. She was informed of the importance of frequent follow-up visits to maximize her success with intensive lifestyle modifications for her multiple health conditions.   Objective:   Blood pressure 134/82, pulse 80, temperature 98 F (36.7 C), height 5\' 2"  (1.575 m), weight 208 lb (94.3 kg), SpO2 98 %. Body mass index is 38.04 kg/m.  General: Cooperative, alert, well developed, in no acute distress. HEENT: Conjunctivae and lids unremarkable. Cardiovascular: Regular rhythm.  Lungs: Normal work of breathing. Neurologic: No focal deficits.   Lab Results  Component Value Date   CREATININE 0.70 10/02/2019   BUN 12 10/02/2019   NA 139 10/02/2019   K 4.6 10/02/2019   CL 105 10/02/2019   CO2 21 10/02/2019   Lab Results  Component Value Date   ALT 15 10/02/2019   AST 12 10/02/2019   ALKPHOS 59 10/02/2019   BILITOT 0.3 10/02/2019   Lab Results  Component Value Date   HGBA1C 5.1 02/05/2020   HGBA1C 5.0 10/02/2019   HGBA1C 5.2 06/21/2019   HGBA1C 5.3 05/24/2019   Lab Results  Component Value Date   INSULIN 25.2 (H) 02/05/2020   INSULIN 34.3 (H) 10/02/2019   INSULIN 31.7 (H) 06/21/2019   INSULIN CANCELED 05/24/2019   Lab Results  Component Value Date   TSH 3.020 06/21/2019   Lab Results  Component Value Date   CHOL 196 02/05/2020   HDL 46 02/05/2020   LDLCALC 122 (H) 02/05/2020   TRIG 156 (H) 02/05/2020   Lab Results  Component Value Date   WBC 9.7 05/24/2019   HGB 13.9 05/24/2019   HCT 42.2 05/24/2019   MCV 85 05/24/2019   No results found for: IRON, TIBC, FERRITIN  Attestation Statements:   Reviewed by clinician on day of visit: allergies, medications, problem list, medical history,  surgical history, family history, social history, and previous encounter notes.  07/24/2019, am acting as Fernanda Drum for Energy manager, DO   I have reviewed the above documentation for accuracy and completeness, and I agree with the above. [employer], DO

## 2020-06-22 ENCOUNTER — Other Ambulatory Visit (INDEPENDENT_AMBULATORY_CARE_PROVIDER_SITE_OTHER): Payer: Self-pay | Admitting: Physician Assistant

## 2020-06-22 DIAGNOSIS — E559 Vitamin D deficiency, unspecified: Secondary | ICD-10-CM

## 2020-06-24 ENCOUNTER — Other Ambulatory Visit (INDEPENDENT_AMBULATORY_CARE_PROVIDER_SITE_OTHER): Payer: Self-pay | Admitting: Bariatrics

## 2020-06-24 DIAGNOSIS — E8881 Metabolic syndrome: Secondary | ICD-10-CM

## 2020-06-27 ENCOUNTER — Encounter (INDEPENDENT_AMBULATORY_CARE_PROVIDER_SITE_OTHER): Payer: Self-pay | Admitting: Physician Assistant

## 2020-06-27 ENCOUNTER — Encounter (INDEPENDENT_AMBULATORY_CARE_PROVIDER_SITE_OTHER): Payer: Self-pay

## 2020-06-27 ENCOUNTER — Other Ambulatory Visit: Payer: Self-pay

## 2020-06-27 ENCOUNTER — Ambulatory Visit (INDEPENDENT_AMBULATORY_CARE_PROVIDER_SITE_OTHER): Payer: 59 | Admitting: Physician Assistant

## 2020-06-27 VITALS — BP 138/84 | HR 88 | Temp 97.8°F | Ht 62.0 in | Wt 210.0 lb

## 2020-06-27 DIAGNOSIS — E7849 Other hyperlipidemia: Secondary | ICD-10-CM

## 2020-06-27 DIAGNOSIS — E6609 Other obesity due to excess calories: Secondary | ICD-10-CM

## 2020-06-27 DIAGNOSIS — Z6838 Body mass index (BMI) 38.0-38.9, adult: Secondary | ICD-10-CM | POA: Diagnosis not present

## 2020-06-27 DIAGNOSIS — E559 Vitamin D deficiency, unspecified: Secondary | ICD-10-CM | POA: Diagnosis not present

## 2020-06-27 NOTE — Progress Notes (Signed)
Office: [phone]  /  Fax: [phone]    Date: July 04, 2020   Appointment Start Time: 9:03am Duration: 41 minutes Provider: Glennie Isle, Psy.D. Type of Session: Intake for Individual Therapy  Location of Patient: Parked in car Location of Provider: Provider's Home Type of Contact: Telepsychological Visit via MyChart Video Visit  Informed Consent: Prior to proceeding with today's appointment, two pieces of identifying information were obtained. In addition, Shreya's physical location at the time of this appointment was obtained as well a phone number she could be reached at in the event of technical difficulties. Nikaela and this provider participated in today's telepsychological service.   The provider's role was explained to [name]. The provider reviewed and discussed issues of confidentiality, privacy, and limits therein (e.g., reporting obligations). In addition to verbal informed consent, written informed consent for psychological services was obtained prior to the initial appointment. Since the clinic is not a 24/7 crisis center, mental health emergency resources were shared and this  provider explained MyChart, e-mail, voicemail, and/or other messaging systems should be utilized only for non-emergency reasons. This provider also explained that information obtained during appointments will be placed in [facility] medical record and relevant information will be shared with other providers at [facility] for coordination of care. Moreover, Georgi agreed information may be shared with other [facility] providers as needed for coordination of care. By signing the service agreement document, Jalee provided written consent for coordination of care. Prior to initiating telepsychological services, Alexsia completed an informed consent document, which included the development of a safety plan (i.e., an emergency contact, nearest emergency room, and emergency  resources) in the event of an emergency/crisis. Indica expressed understanding of the rationale of the safety plan. Markeria verbally acknowledged understanding she is ultimately responsible for understanding her insurance benefits for telepsychological and in-person services. This provider also reviewed confidentiality, as it relates to telepsychological services, as well as the rationale for telepsychological services (i.e., to reduce exposure risk to COVID-19). Alvena  acknowledged understanding that appointments cannot be recorded without both party consent and she is aware she is responsible for securing confidentiality on her end of the session. Breuna verbally consented to proceed.  Chief Complaint/HPI: Karie was referred by Abby Potash, PA-C. Per the note for the visit with Abby Potash, PA-C on June 27, 2020, "Brier states that last week she had RSV and she also had a stressful week with school starting back, so she "gave up" on her plan.  She is feeling better and ready to get back on track.  Due to increased stress and comfort eating, she wants to go back to see Dr. Otis Dials previously met with this provider for five appointments from August 2020 until November 2020.  During today's appointment, Blake reported experiencing work related stressors, noting she is now working full-time. She also discussed feeling symptoms of panic secondary to having COVID-19. The recent stressors have reportedly triggered emotional eating and overeating behaviors. More specifically, Donise reported engaging in mindless eating (e.g., eating an entire bag of chips). Ceriah discussed feeling "freaked out" as she was starting to feel out of control with her eating habits. She expressed desire to focus further on mindfulness.   Mental Status Examination:  Appearance: well groomed and appropriate hygiene  Behavior: appropriate to circumstances Mood: euthymic Affect: mood congruent Speech: normal in rate, volume,  and tone Eye Contact: appropriate Psychomotor Activity: appropriate Gait: unable to assess Thought Process: linear, logical, and goal directed  Thought Content/Perception: denies suicidal and homicidal ideation, plan, and intent and no hallucinations, delusions, bizarre thinking or behavior reported or observed Orientation: time, person, place, and purpose of appointment Memory/Concentration: memory, attention, language, and fund of knowledge intact  Insight/Judgment: good  Family & Psychosocial History: Lindalou reported she has a boyfriend and does not have any children. 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 Additionally, Ellenora shared her highest level of education obtained is bachelor's degree. Currently, Sherrise's social support system consists of her parents, brother, and partner. Moreover, Deone stated she resides with her partner.   Medical History:  Past Medical History:  Diagnosis Date  . ADHD (attention deficit hyperactivity disorder)   . Lactose intolerance    No past surgical history on file. Current Outpatient Medications on File Prior to Visit  Medication Sig Dispense Refill  . ibuprofen (ADVIL) 200 MG tablet Take 200 mg by mouth every 6 (six) hours as needed.    Marland Kitchen lisdexamfetamine (VYVANSE) 20 MG capsule Take 20 mg by mouth daily.    Marland Kitchen loratadine (CLARITIN) 10 MG tablet Take 10 mg by mouth daily.    . metFORMIN (GLUCOPHAGE) 500 MG tablet Take 1 tablet (500 mg total) by mouth daily with breakfast. 30 tablet 0  . norethindrone-ethinyl estradiol (CYCLAFEM) 0.5/0.75/1-35 MG-MCG tablet Take 1 tablet by mouth daily.    . Vitamin D, Ergocalciferol, (DRISDOL) 1.25 MG (50000 UNIT) CAPS capsule TAKE 1 CAPSULE (50,000 UNITS TOTAL) BY MOUTH EVERY 7 (SEVEN) DAYS. 4 capsule 0   No current facility-administered medications on file prior to visit.  Mallarie denied a history of head injuries and loss of consciousness.   Mental Health  History: Shirlette reported she is currently prescribed Vyvanse by her PCP, which she finds helpful with day to day functioning. Since the last appointment with this provider, Clemmie Krill stated she was seeing Francie Massing with [facility]. She explained due to scheduling conflicts, she stopped services with her. She reported she was diagnosed with Seasonal Affective Disorder during the course of treatment. Alila expressed desire to find a new provider. Catilyn reported there is no history of hospitalizations for psychiatric concerns. Chamika shared her father suffers from anxiety. Declan reported there is no history of trauma including psychological, physical  and sexual abuse, as well as neglect.   Yaretzy described her typical mood lately as "a little rough" at times, but "generally happy." Aside from concerns noted above and endorsed on the PHQ-9 and GAD-7, Noe reported experiencing decreased motivation with her meal plan and some social withdrawal. Dung endorsed "very occasional" alcohol use. She denied tobacco use. She denied illicit/recreational substance use. Regarding caffeine intake, Solymar reported consuming 1-2 cups of coffee daily. Furthermore, Catha indicated she is not experiencing the following: hallucinations and delusions, paranoia, symptoms of mania , crying spells and panic attacks. She also denied history of and current suicidal ideation, plan, and intent; history of and current homicidal ideation, plan, and intent; and history of and current engagement in self-harm.  The following strengths were reported by [facility]: very nurturing, silly, caring, and loving. The following strengths were observed by this provider: ability to express thoughts and feelings during the therapeutic session, ability to establish and benefit from a therapeutic relationship, willingness to work toward established goal(s) with the clinic and ability to engage in reciprocal conversation.   Legal History: Makaylie  reported there is no history of legal involvement.   Structured Assessments Results: The Patient Health Questionnaire-9 (PHQ-9) is a  self-report measure that assesses symptoms and severity of depression over the course of the last two weeks. Alexandria obtained a score of 5 suggesting mild depression. Stepheny finds the endorsed symptoms to be somewhat difficult. [0= Not at all; 1= Several days; 2= More than half the days; 3= Nearly every day] Little interest or pleasure in doing things 1  Feeling down, depressed, or hopeless 0  Trouble falling or staying asleep, or sleeping too much 1  Feeling tired or having little energy 1  Poor appetite or overeating 1  Feeling bad about yourself --- or that you are a failure or have let yourself or your family down 1  Trouble concentrating on things, such as reading the newspaper or watching television 0  Moving or speaking so slowly that other people could have noticed? Or the opposite --- being so fidgety or restless that you have been moving around a lot more than usual 0  Thoughts that you would be better off dead or hurting yourself in some way 0  PHQ-9 Score 5    The Generalized Anxiety Disorder-7 (GAD-7) is a brief self-report measure that assesses symptoms of anxiety over the course of the last two weeks. Nikyla obtained a score of 4 suggesting minimal anxiety. Emelee finds the endorsed symptoms to be somewhat difficult. [0= Not at all; 1= Several days; 2= Over half the days; 3= Nearly every day] Feeling nervous, anxious, on edge 1  Not being able to stop or control worrying 0  Worrying too much about different things 0  Trouble relaxing 1  Being so restless that it's hard to sit still 1  Becoming easily annoyed or irritable 1  Feeling afraid as if something awful might happen 0  GAD-7 Score 4   Interventions:  Conducted a chart review Focused on rapport building Verbally administered PHQ-9 and GAD-7 for symptom monitoring Provided emphatic  reflections and validation Collaborated with patient on a treatment goal  Psychoeducation provided regarding pleasurable activities Psychoeducation provided regarding self-care Discussed longer-term therapeutic services  Provisional DSM-5 Diagnosis(es): 311 (F32.8) Other Specified Depressive Disorder, Emotional Eating Behaviors and 314.01 (F90.9) Unspecified Attention-Deficit/Hyperactivity Disorder   Plan: Erienne appears able and willing to participate as evidenced by collaboration on a treatment goal, engagement in reciprocal conversation, and asking questions as needed for clarification. The next appointment will be scheduled in two weeks, which will be via MyChart Video Visit. The following treatment goal was established: increase coping skills. Kisha provided verbal consent during today's appointment for this provider to send a handout with referral options via e-mail. This provider will regularly review the treatment plan and medical chart to keep informed of status changes. Keirra expressed understanding and agreement with the initial treatment plan of care.   Psychoeducation regarding the importance of self-care utilizing the oxygen mask metaphor was provided. Additionally, psychoeducation regarding pleasurable activities, including its impact on emotional eating and overall well-being was provided. Deetya was provided with a handout with various options of pleasurable activities, and was encouraged to engage in one activity a day and additional activities as needed when triggered to emotionally eat. Adri agreed. Kenyada provided verbal consent during today's appointment for this provider to send a handout with pleasurable activities via e-mail.

## 2020-06-30 NOTE — Progress Notes (Signed)
Chief Complaint:   OBESITY Norma Cohen is here to discuss her progress with her obesity treatment plan along with follow-up of her obesity related diagnoses. Norma Cohen is on the Category 4 Plan and states she is following her eating plan approximately 65% of the time. Norma Cohen states she is doing Body Groove for 30-35 minutes 3 times per week.  Today's visit was #: 21 Starting weight: 218 lbs Starting date: 05/24/2019 Today's weight: 210 lbs Today's date: 06/27/2020 Total lbs lost to date: 8 lbs Total lbs lost since last in-office visit: 0  Interim History: Norma Cohen states that last week she had RSV and she also had a stressful week with school starting back, so she "gave up" on her plan.  She is feeling better and ready to get back on track.  Due to increased stress and comfort eating, she wants to go back to see Dr. Dewaine Conger.  Subjective:   1. Vitamin D deficiency Norma Cohen's Vitamin D level was 51.4 on 02/05/2020. She is currently taking prescription vitamin D 50,000 IU each week. She denies nausea, vomiting or muscle weakness.  She is not walking outside regularly.  2. Other hyperlipidemia Norma Cohen has hyperlipidemia and has been trying to improve her cholesterol levels with intensive lifestyle modification including a low saturated fat diet, exercise and weight loss. She denies any chest pain, claudication or myalgias.  Last lipid panel not at goal.  She is exercising regularly.  Lab Results  Component Value Date   ALT 15 10/02/2019   AST 12 10/02/2019   ALKPHOS 59 10/02/2019   BILITOT 0.3 10/02/2019   Lab Results  Component Value Date   CHOL 196 02/05/2020   HDL 46 02/05/2020   LDLCALC 122 (H) 02/05/2020   TRIG 156 (H) 02/05/2020   Assessment/Plan:   1. Vitamin D deficiency Low Vitamin D level contributes to fatigue and are associated with obesity, breast, and colon cancer. She agrees to continue to take prescription Vitamin D @50 ,000 IU every week and will follow-up for routine testing of  Vitamin D, at least 2-3 times per year to avoid over-replacement.    2. Other hyperlipidemia Cardiovascular risk and specific lipid/LDL goals reviewed.  We discussed several lifestyle modifications today and Norma Cohen will continue to work on diet, exercise and weight loss efforts. Orders and follow up as documented in patient record.  Continue with exercise and weight loss.  Counseling Intensive lifestyle modifications are the first line treatment for this issue. . Dietary changes: Increase soluble fiber. Decrease simple carbohydrates. . Exercise changes: Moderate to vigorous-intensity aerobic activity 150 minutes per week if tolerated. . Lipid-lowering medications: see documented in medical record.  3. Class 2 obesity due to excess calories without serious comorbidity with body mass index (BMI) of 38.0 to 38.9 in adult Norma Cohen is currently in the action stage of change. As such, her goal is to continue with weight loss efforts. She has agreed to the Category 4 Plan.   Exercise goals: For substantial health benefits, adults should do at least 150 minutes (2 hours and 30 minutes) a week of moderate-intensity, or 75 minutes (1 hour and 15 minutes) a week of vigorous-intensity aerobic physical activity, or an equivalent combination of moderate- and vigorous-intensity aerobic activity. Aerobic activity should be performed in episodes of at least 10 minutes, and preferably, it should be spread throughout the week.  Behavioral modification strategies: no skipping meals and meal planning and cooking strategies.  Norma Cohen has agreed to follow-up with our clinic in 2 weeks. She  was informed of the importance of frequent follow-up visits to maximize her success with intensive lifestyle modifications for her multiple health conditions.   Objective:   Blood pressure 138/84, pulse 88, temperature 97.8 F (36.6 C), height 5\' 2"  (1.575 m), weight 210 lb (95.3 kg), SpO2 98 %. Body mass index is 38.41  kg/m.  General: Cooperative, alert, well developed, in no acute distress. HEENT: Conjunctivae and lids unremarkable. Cardiovascular: Regular rhythm.  Lungs: Normal work of breathing. Neurologic: No focal deficits.   Lab Results  Component Value Date   CREATININE 0.70 10/02/2019   BUN 12 10/02/2019   NA 139 10/02/2019   K 4.6 10/02/2019   CL 105 10/02/2019   CO2 21 10/02/2019   Lab Results  Component Value Date   ALT 15 10/02/2019   AST 12 10/02/2019   ALKPHOS 59 10/02/2019   BILITOT 0.3 10/02/2019   Lab Results  Component Value Date   HGBA1C 5.1 02/05/2020   HGBA1C 5.0 10/02/2019   HGBA1C 5.2 06/21/2019   HGBA1C 5.3 05/24/2019   Lab Results  Component Value Date   INSULIN 25.2 (H) 02/05/2020   INSULIN 34.3 (H) 10/02/2019   INSULIN 31.7 (H) 06/21/2019   INSULIN CANCELED 05/24/2019   Lab Results  Component Value Date   TSH 3.020 06/21/2019   Lab Results  Component Value Date   CHOL 196 02/05/2020   HDL 46 02/05/2020   LDLCALC 122 (H) 02/05/2020   TRIG 156 (H) 02/05/2020   Lab Results  Component Value Date   WBC 9.7 05/24/2019   HGB 13.9 05/24/2019   HCT 42.2 05/24/2019   MCV 85 05/24/2019   Attestation Statements:   Reviewed by clinician on day of visit: allergies, medications, problem list, medical history, surgical history, family history, social history, and previous encounter notes.  Time spent on visit including pre-visit chart review and post-visit care and charting was 30 minutes.   I, 07/24/2019, CMA, am acting as transcriptionist for Insurance claims handler, PA-C  I have reviewed the above documentation for accuracy and completeness, and I agree with the above. Norma Cliche, PA-C

## 2020-07-04 ENCOUNTER — Telehealth (INDEPENDENT_AMBULATORY_CARE_PROVIDER_SITE_OTHER): Payer: PRIVATE HEALTH INSURANCE | Admitting: Psychology

## 2020-07-04 DIAGNOSIS — F3289 Other specified depressive episodes: Secondary | ICD-10-CM | POA: Diagnosis not present

## 2020-07-04 DIAGNOSIS — F909 Attention-deficit hyperactivity disorder, unspecified type: Secondary | ICD-10-CM

## 2020-07-04 NOTE — Progress Notes (Signed)
Office: [phone]  /  Fax: [phone]    Date: July 18, 2020   Appointment Start Time: 8:29am Duration: 28 minutes Cohen: Norma Cohen, Psy.D. Type of Session: Individual Therapy  Location of Patient: Home Location of Cohen: Provider's Home Type of Contact: Telepsychological Visit via MyChart Video Visit  Session Content: Norma Cohen is a 29 y.o. female presenting for a follow-up appointment to address the previously established treatment goal of increasing coping skills. Today's appointment was a telepsychological visit due to COVID-19. Norma Cohen provided verbal consent for today's telepsychological appointment and she is aware she is responsible for securing confidentiality on her end of the session. Prior to proceeding with today's appointment, Norma Cohen's physical location at the time of this appointment was obtained as well a phone number she could be reached at in the event of technical difficulties. Norma Cohen participated in today's telepsychological service.   This Cohen conducted a brief check-in. Norma Cohen reported she is "working a lot." She indicated she met up with her best friend after a long time last weekend. Reviewed pleasurable activities. She shared about activities she engaged in and she described an improvement in her mood and a reduction in emotional eating; it was recommended she continue to engage in pleasurable activities. Also, positive reinforcement was provided. This Cohen and Norma Cohen discussed the dieting mentality, as well as all or nothing thinking. Psychoeducation regarding SMART goals was provided and Norma Cohen was engaged in goal setting. The following goal was established: Norma Cohen will eat dinner congruent to her meal plan at least 5 out of 7 days a week between now and the next appointment with this Cohen. Notably, this Cohen discussed her upcoming maternity leave toward the end of November. Norma Cohen acknowledged understanding given the uncertain  nature of the circumstances, this Cohen may be out of the office sooner. All questions/concerns were addressed. Norma Cohen denied any concerns. Overall, Norma Cohen was receptive to today's appointment as evidenced by openness to sharing, responsiveness to feedback, and willingness to work toward the established SMART goal.  Mental Status Examination:  Appearance: well groomed and appropriate hygiene  Behavior: appropriate to circumstances Mood: euthymic Affect: mood congruent Speech: normal in rate, volume, and tone Eye Contact: appropriate Psychomotor Activity: appropriate Gait: unable to assess Thought Process: linear, logical, and goal directed  Thought Content/Perception: no hallucinations, delusions, bizarre thinking or behavior reported or observed and no evidence of suicidal and homicidal ideation, plan, and intent Orientation: time, person, place, and purpose of appointment Memory/Concentration: memory, attention, language, and fund of knowledge intact  Insight/Judgment: good  Interventions:  Conducted a brief chart review Provided empathic reflections and validation Reviewed content from the previous session Provided positive reinforcement Employed supportive psychotherapy interventions to facilitate reduced distress and to improve coping skills with identified stressors Engaged patient in goal setting Psychoeducation provided regarding SMART goals  DSM-5 Diagnosis(es): 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 Norma Cohen has demonstrated progress in her goal as evidenced by increased awareness of hunger patterns and increased awareness of triggers for emotional eating. Norma Cohen also continues to demonstrate willingness to engage in learned  skill(s).  Plan: The next appointment will be scheduled in two weeks, which will be via MyChart Video Visit. The next session will focus on working towards the established treatment goal. Additionally, Norma Cohen indicated a plan to find a new therapist for traditional  therapy this weekend.

## 2020-07-11 ENCOUNTER — Telehealth (INDEPENDENT_AMBULATORY_CARE_PROVIDER_SITE_OTHER): Payer: 59 | Admitting: Psychology

## 2020-07-15 ENCOUNTER — Ambulatory Visit (INDEPENDENT_AMBULATORY_CARE_PROVIDER_SITE_OTHER): Payer: 59 | Admitting: Physician Assistant

## 2020-07-15 ENCOUNTER — Encounter (INDEPENDENT_AMBULATORY_CARE_PROVIDER_SITE_OTHER): Payer: Self-pay | Admitting: Physician Assistant

## 2020-07-15 ENCOUNTER — Other Ambulatory Visit: Payer: Self-pay

## 2020-07-15 VITALS — BP 139/88 | HR 79 | Temp 98.0°F | Ht 62.0 in | Wt 210.0 lb

## 2020-07-15 DIAGNOSIS — E559 Vitamin D deficiency, unspecified: Secondary | ICD-10-CM | POA: Diagnosis not present

## 2020-07-15 DIAGNOSIS — E8881 Metabolic syndrome: Secondary | ICD-10-CM | POA: Diagnosis not present

## 2020-07-15 DIAGNOSIS — Z6838 Body mass index (BMI) 38.0-38.9, adult: Secondary | ICD-10-CM

## 2020-07-15 DIAGNOSIS — Z9189 Other specified personal risk factors, not elsewhere classified: Secondary | ICD-10-CM

## 2020-07-15 MED ORDER — METFORMIN HCL 500 MG PO TABS: 500.0000 mg | ORAL_TABLET | Freq: Every day | ORAL | 0 refills | Status: DC

## 2020-07-15 MED ORDER — VITAMIN D (ERGOCALCIFEROL) 1.25 MG (50000 UNIT) PO CAPS: 50000.0000 [IU] | ORAL_CAPSULE | ORAL | 0 refills | Status: DC

## 2020-07-15 NOTE — Progress Notes (Signed)
Chief Complaint:   OBESITY Norma Cohen is here to discuss her progress with her obesity treatment plan along with follow-up of her obesity related diagnoses. Norma Cohen is on the Category 4 Plan and states she is following her eating plan approximately 75% of the time. [name] states she is doing body group for 30-60 minutes 3-4 times per week.  Today's visit was #: 22 Starting weight: 218 lbs Starting date: 05/24/2019 Today's weight: 210 lbs Today's date: 07/15/2020 Total lbs lost to date: 8 Total lbs lost since last in-office visit: 0  Interim History: Norma Cohen notes that she has been eating out more often recently. She states that she is not meal planning well.  Subjective:   1. Vitamin D deficiency Norma Cohen denies nausea, vomiting, or muscle weakness. She is on prescription Vit D.  2. Insulin resistance Norma Cohen is on metformin, and she notes it is helping with cravings.  3. At risk for hypoglycemia Norma Cohen is at increased risk for hypoglycemia due to changes in diet, diagnosis of diabetes, and/or insulin use.    Assessment/Plan:   1. Vitamin D deficiency Low Vitamin D level contributes to fatigue and are associated with obesity, breast, and colon cancer. We will refill prescription Vitamin D for 1 month. Norma Cohen will follow-up for routine testing of Vitamin D, at least 2-3 times per year to avoid over-replacement.  - Vitamin D, Ergocalciferol, (DRISDOL) 1.25 MG (50000 UNIT) CAPS capsule; Take 1 capsule (50,000 Units total) by mouth every 7 (seven) days.  Dispense: 4 capsule; Refill: 0  2. Insulin resistance Norma Cohen will continue to work on weight loss, exercise, and decreasing simple carbohydrates to help decrease the risk of diabetes. We will refill metformin for 1 month. Norma Cohen agreed to follow-up with [place] as directed to closely monitor her progress.  - metFORMIN (GLUCOPHAGE) 500 MG tablet; Take 1 tablet (500 mg total) by mouth daily with breakfast.  Dispense: 30 tablet; Refill: 0  3. At risk for  hypoglycemia Norma Cohen was given approximately 15 minutes of counseling today regarding prevention of hypoglycemia. She was advised of symptoms of hypoglycemia. Norma Cohen was instructed to avoid skipping meals, eat regular protein rich meals and schedule low calorie snacks as needed.   Repetitive spaced learning was employed today to elicit superior memory formation and behavioral change  4. Class 2 severe obesity with serious comorbidity and body mass index (BMI) of 38.0 to 38.9 in adult, unspecified obesity type (HCC) Norma Cohen is currently in the action stage of change. As such, her goal is to continue with weight loss efforts. She has agreed to the Category 4 Plan and keeping a food journal and adhering to recommended goals of 550-700 calories and 45 grams of protein at supper daily.   Exercise goals: As is.  Behavioral modification strategies: decreasing eating out and meal planning and cooking strategies.  Norma Cohen has agreed to follow-up with our clinic in 3 weeks. She was informed of the importance of frequent follow-up visits to maximize her success with intensive lifestyle modifications for her multiple health conditions.   Objective:   Blood pressure 139/88, pulse 79, temperature 98 F (36.7 C), height 5\' 2"  (1.575 m), weight 210 lb (95.3 kg), SpO2 98 %. Body mass index is 38.41 kg/m.  General: Cooperative, alert, well developed, in no acute distress. HEENT: Conjunctivae and lids unremarkable. Cardiovascular: Regular rhythm.  Lungs: Normal work of breathing. Neurologic: No focal deficits.   Lab Results  Component Value Date   CREATININE 0.70 10/02/2019   BUN 12 10/02/2019  NA 139 10/02/2019   K 4.6 10/02/2019   CL 105 10/02/2019   CO2 21 10/02/2019   Lab Results  Component Value Date   ALT 15 10/02/2019   AST 12 10/02/2019   ALKPHOS 59 10/02/2019   BILITOT 0.3 10/02/2019   Lab Results  Component Value Date   HGBA1C 5.1 02/05/2020   HGBA1C 5.0 10/02/2019   HGBA1C 5.2  06/21/2019   HGBA1C 5.3 05/24/2019   Lab Results  Component Value Date   INSULIN 25.2 (H) 02/05/2020   INSULIN 34.3 (H) 10/02/2019   INSULIN 31.7 (H) 06/21/2019   INSULIN CANCELED 05/24/2019   Lab Results  Component Value Date   TSH 3.020 06/21/2019   Lab Results  Component Value Date   CHOL 196 02/05/2020   HDL 46 02/05/2020   LDLCALC 122 (H) 02/05/2020   TRIG 156 (H) 02/05/2020   Lab Results  Component Value Date   WBC 9.7 05/24/2019   HGB 13.9 05/24/2019   HCT 42.2 05/24/2019   MCV 85 05/24/2019   No results found for: IRON, TIBC, FERRITIN  Attestation Statements:   Reviewed by clinician on day of visit: allergies, medications, problem list, medical history, surgical history, family history, social history, and previous encounter notes.    Trude Mcburney, am acting as transcriptionist for [employer], PA-C.  I have reviewed the above documentation for accuracy and completeness, and I agree with the above. Alois Cliche, PA-C

## 2020-07-18 ENCOUNTER — Telehealth (INDEPENDENT_AMBULATORY_CARE_PROVIDER_SITE_OTHER): Payer: 59 | Admitting: Psychology

## 2020-07-18 DIAGNOSIS — F909 Attention-deficit hyperactivity disorder, unspecified type: Secondary | ICD-10-CM

## 2020-07-18 DIAGNOSIS — F3289 Other specified depressive episodes: Secondary | ICD-10-CM | POA: Diagnosis not present

## 2020-07-18 NOTE — Progress Notes (Signed)
  Office: [phone]  /  Fax: [phone]    Date: August 01, 2020   Appointment Start Time: 9:01am Duration: 24 minutes Provider: Lawerance Cruel, Psy.D. Type of Session: Individual Therapy  Location of Patient: Home Location of Provider: Provider's Home Type of Contact: Telepsychological Visit via MyChart Video Visit  Session Content: Norma Cohen is a 29 y.o. female presenting for a follow-up appointment to address the previously established treatment goal of increasing coping skills. Today's appointment was a telepsychological visit due to COVID-19. [name] provided verbal consent for today's telepsychological appointment and she is aware she is responsible for securing confidentiality on her end of the session. Prior to proceeding with today's appointment, [name]'s physical location at the time of this appointment was obtained as well a phone number she could be reached at in the event of technical difficulties. [name] and this provider participated in today's telepsychological service.   This provider conducted a brief check-in. [name] reported an improvement in work related stress, noting she is "trying to be more mindful about taking care" of herself. Positive reinforcement was provided. [name] indicated she may be experiencing symptoms (feeling down, decreased motivation, feeling overwhelmed, feeling forgetful, crying spells) of "seasonal affective disorder." As such, she requested a referral as her last appointment with her previous therapist was in July of this year. A referral was placed with [name] with [facility] verbal consent.   Reviewed previously established SMART goal for dinner. [name] reported, "It went better than I thought it would." Brief psychoeducation regarding common cognitive distortions associated with emotional eating was provided. [name] provided verbal consent during today's appointment for this provider to send a handout about cognitive distortions via e-mail.   [name] was receptive to today's appointment as evidenced by openness to sharing, responsiveness to feedback, and willingness to identify unhelpful thinking patterns associated with eating habits.  Mental Status Examination:  Appearance: well groomed and appropriate hygiene  Behavior: appropriate to circumstances Mood: euthymic Affect: mood congruent Speech: normal in rate, volume, and tone Eye Contact: appropriate Psychomotor Activity: appropriate Gait: unable to assess Thought Process: linear, logical, and goal directed  Thought Content/Perception: no hallucinations, delusions, bizarre thinking or behavior reported or observed and no evidence of suicidal and homicidal ideation, plan, and intent Orientation: time, person, place, and purpose of appointment Memory/Concentration: memory, attention, language, and fund of knowledge intact  Insight/Judgment: good  Interventions:  Conducted a brief chart review Provided empathic reflections and validation Reviewed content from the previous session Provided positive reinforcement Employed supportive psychotherapy interventions to facilitate reduced distress and to improve coping skills with identified stressors Psychoeducation provided regarding cognitive distortions  Recommended/discussed option for longer-term therapeutic services  DSM-5 Diagnosis(es): 311 (F32.8) Other Specified Depressive Disorder, Emotional Eating Behaviors and 314.01 (F90.9) Unspecified Attention-Deficit/Hyperactivity Disorder   Treatment Goal & Progress: During the initial appointment with this provider, the following treatment goal was established: increase coping skills. [name] has demonstrated progress in her goal as evidenced by increased awareness of hunger patterns and increased awareness of triggers for emotional eating. [name] also continues to demonstrate willingness to engage in learned skill(s).  Plan: The next appointment will be scheduled in 1-2 weeks, which  will be via MyChart Video Visit. The next session will focus on working towards the established treatment goal.

## 2020-08-01 ENCOUNTER — Telehealth (INDEPENDENT_AMBULATORY_CARE_PROVIDER_SITE_OTHER): Payer: 59 | Admitting: Psychology

## 2020-08-01 DIAGNOSIS — F3289 Other specified depressive episodes: Secondary | ICD-10-CM | POA: Diagnosis not present

## 2020-08-01 DIAGNOSIS — F909 Attention-deficit hyperactivity disorder, unspecified type: Secondary | ICD-10-CM | POA: Diagnosis not present

## 2020-08-01 NOTE — Progress Notes (Signed)
  Office: [phone]  /  Fax: [phone]    Date: August 13, 2020   Appointment Start Time: 8:31am Duration: 28 minutes Provider: Lawerance Cruel, Psy.D. Type of Session: Individual Therapy  Location of Patient: Home Location of Provider: Provider's Home Type of Contact: Telepsychological Visit via MyChart Video Visit  Session Content: Kaylin is a 29 y.o. female presenting for a follow-up appointment to address the previously established treatment goal of increasing coping skills. Today's appointment was a telepsychological visit due to COVID-19. Erum provided verbal consent for today's telepsychological appointment and she is aware she is responsible for securing confidentiality on her end of the session. Prior to proceeding with today's appointment, Alexanderia's physical location at the time of this appointment was obtained as well a phone number she could be reached at in the event of technical difficulties. Regnia and this provider participated in today's telepsychological service. Of note, today's appointment was switched to a regular telephone call at 8:50am with Akya's verbal consent due to technical issues.   This provider conducted a brief check-in. Louise shared about recent events, noting her parents are moving to [place] in the coming weeks. She indicated she is trying to help her parents move while arranging to purchase their home. Caliegh reported feeling "all over the place" due to the increased stress. This was further explored, including the impact on overall well-being and eating habits. Reviewed previously discussed cognitive distortions associated with emotional eating. She shared recent examples. Additionally, reviewed the previously shared exercise, Leaves on a Stream, to assist with coping. She was receptive to this provider sending her a link for the exercise via e-mail. Peytin was receptive to today's appointment as evidenced by openness to sharing, responsiveness to feedback, and  willingness to continue engaging in learned skills.  Mental Status Examination:  Appearance: well groomed and appropriate hygiene  Behavior: appropriate to circumstances Mood: euthymic Affect: mood congruent Speech: normal in rate, volume, and tone Eye Contact: appropriate Psychomotor Activity: appropriate Gait: unable to assess Thought Process: linear, logical, and goal directed  Thought Content/Perception: no hallucinations, delusions, bizarre thinking or behavior reported or observed and no evidence of suicidal and homicidal ideation, plan, and intent Orientation: time, person, place, and purpose of appointment Memory/Concentration: memory, attention, language, and fund of knowledge intact  Insight/Judgment: fair  Interventions:  Conducted a brief chart review Provided empathic reflections and validation Reviewed content from the previous session Employed supportive psychotherapy interventions to facilitate reduced distress and to improve coping skills with identified stressors Employed acceptance and commitment interventions to emphasize mindfulness and acceptance without struggle  DSM-5 Diagnosis(es): 311 (F32.8) Other Specified Depressive Disorder, Emotional Eating Behaviors and 314.01 (F90.9) Unspecified Attention-Deficit/Hyperactivity Disorder   Treatment Goal & Progress: During the initial appointment with this provider, the following treatment goal was established: increase coping skills. Sanya has demonstrated progress in her goal as evidenced by increased awareness of hunger patterns and increased awareness of triggers for emotional eating. Jaszmine also discussed increased awareness of unhelpful thinking patterns.   Plan: The next appointment will be scheduled in two weeks, which will be via MyChart Video Visit. The next session will focus on working towards the established treatment goal. Additionally, Brian acknowledged she has not called [facility], but noted  a plan to do so after the appointment with this provider.

## 2020-08-04 ENCOUNTER — Other Ambulatory Visit (INDEPENDENT_AMBULATORY_CARE_PROVIDER_SITE_OTHER): Payer: Self-pay | Admitting: Physician Assistant

## 2020-08-04 DIAGNOSIS — E559 Vitamin D deficiency, unspecified: Secondary | ICD-10-CM

## 2020-08-05 ENCOUNTER — Encounter (INDEPENDENT_AMBULATORY_CARE_PROVIDER_SITE_OTHER): Payer: Self-pay | Admitting: Physician Assistant

## 2020-08-05 ENCOUNTER — Other Ambulatory Visit: Payer: Self-pay

## 2020-08-05 ENCOUNTER — Ambulatory Visit (INDEPENDENT_AMBULATORY_CARE_PROVIDER_SITE_OTHER): Payer: 59 | Admitting: Physician Assistant

## 2020-08-05 VITALS — BP 131/92 | HR 92 | Temp 98.2°F | Ht 62.0 in | Wt 214.0 lb

## 2020-08-05 DIAGNOSIS — E559 Vitamin D deficiency, unspecified: Secondary | ICD-10-CM | POA: Diagnosis not present

## 2020-08-05 DIAGNOSIS — Z9189 Other specified personal risk factors, not elsewhere classified: Secondary | ICD-10-CM

## 2020-08-05 DIAGNOSIS — E8881 Metabolic syndrome: Secondary | ICD-10-CM

## 2020-08-05 DIAGNOSIS — E7849 Other hyperlipidemia: Secondary | ICD-10-CM

## 2020-08-05 DIAGNOSIS — Z6839 Body mass index (BMI) 39.0-39.9, adult: Secondary | ICD-10-CM

## 2020-08-05 MED ORDER — VITAMIN D (ERGOCALCIFEROL) 1.25 MG (50000 UNIT) PO CAPS: 50000.0000 [IU] | ORAL_CAPSULE | ORAL | 0 refills | Status: DC

## 2020-08-06 ENCOUNTER — Other Ambulatory Visit (INDEPENDENT_AMBULATORY_CARE_PROVIDER_SITE_OTHER): Payer: Self-pay | Admitting: Physician Assistant

## 2020-08-06 DIAGNOSIS — E8881 Metabolic syndrome: Secondary | ICD-10-CM

## 2020-08-06 LAB — COMPREHENSIVE METABOLIC PANEL
ALT: 26 IU/L (ref 0–32)
AST: 17 IU/L (ref 0–40)
Albumin/Globulin Ratio: 1.3 (ref 1.2–2.2)
Albumin: 4.3 g/dL (ref 3.9–5.0)
Alkaline Phosphatase: 67 IU/L (ref 44–121)
BUN/Creatinine Ratio: 26 — ABNORMAL HIGH (ref 9–23)
BUN: 16 mg/dL (ref 6–20)
Bilirubin Total: 0.3 mg/dL (ref 0.0–1.2)
CO2: 23 mmol/L (ref 20–29)
Calcium: 9.1 mg/dL (ref 8.7–10.2)
Chloride: 103 mmol/L (ref 96–106)
Creatinine, Ser: 0.62 mg/dL (ref 0.57–1.00)
GFR calc Af Amer: 144 mL/min/{1.73_m2} (ref >59)
GFR calc non Af Amer: 125 mL/min/{1.73_m2} (ref >59)
Globulin, Total: 3.3 g/dL (ref 1.5–4.5)
Glucose: 88 mg/dL (ref 65–99)
Potassium: 4.6 mmol/L (ref 3.5–5.2)
Sodium: 139 mmol/L (ref 134–144)
Total Protein: 7.6 g/dL (ref 6.0–8.5)

## 2020-08-06 LAB — HEMOGLOBIN A1C
Est. average glucose Bld gHb Est-mCnc: 105 mg/dL
Hgb A1c MFr Bld: 5.3 % (ref 4.8–5.6)

## 2020-08-06 LAB — LIPID PANEL
Chol/HDL Ratio: 5 ratio — ABNORMAL HIGH (ref 0.0–4.4)
Cholesterol, Total: 231 mg/dL — ABNORMAL HIGH (ref 100–199)
HDL: 46 mg/dL (ref >39)
LDL Chol Calc (NIH): 162 mg/dL — ABNORMAL HIGH (ref 0–99)
Triglycerides: 127 mg/dL (ref 0–149)
VLDL Cholesterol Cal: 23 mg/dL (ref 5–40)

## 2020-08-06 LAB — VITAMIN D 25 HYDROXY (VIT D DEFICIENCY, FRACTURES): Vit D, 25-Hydroxy: 46.3 ng/mL (ref 30.0–100.0)

## 2020-08-06 LAB — INSULIN, RANDOM: INSULIN: 43.9 u[IU]/mL — ABNORMAL HIGH (ref 2.6–24.9)

## 2020-08-06 NOTE — Progress Notes (Signed)
Chief Complaint:   OBESITY Norma Cohen is here to discuss her progress with her obesity treatment plan along with follow-up of her obesity related diagnoses. Norma Cohen is on the Category 4 Plan and states she is following her eating plan approximately 75% of the time. Norma Cohen states she is walking/cardio/strengthening 30-60 minutes 5 times per week.  Today's visit was #: 23 Starting weight: 218 lbs Starting date: 05/24/2019 Today's weight: 214 lbs Today's date: 08/05/2020 Total lbs lost to date: 4 Total lbs lost since last in-office visit: 0  Interim History: Norma Cohen reports that she has had a sinus infection for last 5 days and has been eating crackers, cereal, and toast. She was also on vacation the last few weeks.  Subjective:   Vitamin D deficiency. Norma Cohen is on prescription Vitamin D. She states she is walking outside regularly. She is due for labs.   Ref. Range 02/05/2020 12:57  Vitamin D, 25-Hydroxy Latest Ref Range: 30.0 - 100.0 ng/mL 51.4   Other hyperlipidemia. Norma Cohen has hyperlipidemia and has been trying to improve her cholesterol levels with intensive lifestyle modification including a low saturated fat diet, exercise and weight loss. She denies any chest pain. Norma Cohen is on no medications. She is due for labs.  Insulin resistance. Norma Cohen has a diagnosis of insulin resistance based on her elevated fasting insulin level >5. She continues to work on diet and exercise to decrease her risk of diabetes. Norma Cohen is on metformin, which she is tolerating well. She is due for labs.  Lab Results  Component Value Date   INSULIN 25.2 (H) 02/05/2020   INSULIN 34.3 (H) 10/02/2019   INSULIN 31.7 (H) 06/21/2019   INSULIN CANCELED 05/24/2019   At risk for heart disease. Norma Cohen is at a higher than average risk for cardiovascular disease due to obesity.   Assessment/Plan:   Vitamin D deficiency. Low Vitamin D level contributes to fatigue and are associated with obesity, breast, and  colon cancer. She was given a refill on her Vitamin D, Ergocalciferol, (DRISDOL) 1.25 MG (50000 UNIT) CAPS capsule every week #4 with 0 refills and VITAMIN D 25 Hydroxy (Vit-D Deficiency, Fractures) level will be checked today.   Other hyperlipidemia. Cardiovascular risk and specific lipid/LDL goals reviewed.  We discussed several lifestyle modifications today and Myya will continue to work on diet, exercise and weight loss efforts. Orders and follow up as documented in patient record. Labs will be checked today.  Counseling Intensive lifestyle modifications are the first line treatment for this issue. . Dietary changes: Increase soluble fiber. Decrease simple carbohydrates. . Exercise changes: Moderate to vigorous-intensity aerobic activity 150 minutes per week if tolerated. . Lipid-lowering medications: see documented in medical record.  Insulin resistance. Norma Cohen will continue to work on weight loss, exercise, and decreasing simple carbohydrates to help decrease the risk of diabetes. Norma Cohen agreed to follow-up with [place] as directed to closely monitor her progress. Labs will be checked today.  At risk for heart disease. Norma Cohen was given approximately 15 minutes of coronary artery disease prevention counseling today. She is 29 y.o. female and has risk factors for heart disease including obesity. We discussed intensive lifestyle modifications today with an emphasis on specific weight loss instructions and strategies.   Repetitive spaced learning was employed today to elicit superior memory formation and behavioral change.  Class 2 severe obesity with serious comorbidity and body mass index (BMI) of 39.0 to 39.9 in adult, unspecified obesity type (HCC).  Norma Cohen is currently in the action stage  of change. As such, her goal is to continue with weight loss efforts. She has agreed to keeping a food journal and adhering to recommended goals of 1800 calories and 115 grams of protein daily.   Exercise goals:  For substantial health benefits, adults should do at least 150 minutes (2 hours and 30 minutes) a week of moderate-intensity, or 75 minutes (1 hour and 15 minutes) a week of vigorous-intensity aerobic physical activity, or an equivalent combination of moderate- and vigorous-intensity aerobic activity. Aerobic activity should be performed in episodes of at least 10 minutes, and preferably, it should be spread throughout the week.  Behavioral modification strategies: increasing lean protein intake and no skipping meals.  Norma Cohen has agreed to follow-up with our clinic in 2 weeks. She was informed of the importance of frequent follow-up visits to maximize her success with intensive lifestyle modifications for her multiple health conditions.   Objective:   Blood pressure (!) 131/92, pulse 92, temperature 98.2 F (36.8 C), temperature source Oral, height 5\' 2"  (1.575 m), weight 214 lb (97.1 kg), SpO2 98 %. Body mass index is 39.14 kg/m.  General: Cooperative, alert, well developed, in no acute distress. HEENT: Conjunctivae and lids unremarkable. Cardiovascular: Regular rhythm.  Lungs: Normal work of breathing. Neurologic: No focal deficits.   Lab Results  Component Value Date   HGBA1C 5.1 02/05/2020   HGBA1C 5.0 10/02/2019   HGBA1C 5.2 06/21/2019   HGBA1C 5.3 05/24/2019   Lab Results  Component Value Date   INSULIN 25.2 (H) 02/05/2020   INSULIN 34.3 (H) 10/02/2019   INSULIN 31.7 (H) 06/21/2019   INSULIN CANCELED 05/24/2019   Lab Results  Component Value Date   TSH 3.020 06/21/2019   Lab Results  Component Value Date   WBC 9.7 05/24/2019   HGB 13.9 05/24/2019   HCT 42.2 05/24/2019   MCV 85 05/24/2019   No results found for: IRON, TIBC, FERRITIN  Attestation Statements:   Reviewed by clinician on day of visit: allergies, medications, problem list, medical history, surgical history, family history, social history, and previous encounter notes.  I10/02/2019, am acting as  transcriptionist for Marianna [employer], PA-C   I have reviewed the above documentation for accuracy and completeness, and I agree with the above. Alois Cliche, PA-C

## 2020-08-13 ENCOUNTER — Telehealth (INDEPENDENT_AMBULATORY_CARE_PROVIDER_SITE_OTHER): Payer: 59 | Admitting: Psychology

## 2020-08-13 DIAGNOSIS — F909 Attention-deficit hyperactivity disorder, unspecified type: Secondary | ICD-10-CM

## 2020-08-13 DIAGNOSIS — F3289 Other specified depressive episodes: Secondary | ICD-10-CM | POA: Diagnosis not present

## 2020-08-13 NOTE — Progress Notes (Unsigned)
Office: [phone]  /  Fax: [phone]    Date: August 27, 2020   Appointment Start Time: *** Duration: *** minutes Provider: Lawerance Cruel, Psy.D. Type of Session: Individual Therapy  Location of Patient: {gbptloc:23249} Location of Provider: Provider's Home Type of Contact: Telepsychological Visit via MyChart Video Visit  Session Content: Norma Cohen is a 29 y.o. female presenting for a follow-up appointment to address the previously established treatment goal of increasing coping skills. Today's appointment was a telepsychological visit due to COVID-19. Norma Cohen provided verbal consent for today's telepsychological appointment and she is aware she is responsible for securing confidentiality on her end of the session. Prior to proceeding with today's appointment, Norma Cohen's physical location at the time of this appointment was obtained as well a phone number she could be reached at in the event of technical difficulties. Norma Cohen and this provider participated in today's telepsychological service.   This provider conducted a brief check-in and verbally administered the PHQ-9 and GAD-7. *** Norma Cohen was receptive to today's appointment as evidenced by openness to sharing, responsiveness to feedback, and {gbreceptiveness:23401}.  Mental Status Examination:  Appearance: {Appearance:22431} Behavior: {Behavior:22445} Mood: {gbmood:21757} Affect: {Affect:22436} Speech: {Speech:22432} Eye Contact: {Eye Contact:22433} Psychomotor Activity: {Motor Activity:22434} Gait: {gbgait:23404} Thought Process: {thought process:22448}  Thought Content/Perception: {disturbances:22451} Orientation: {Orientation:22437} Memory/Concentration: {gbcognition:22449} Insight/Judgment: {Insight:22446}  Structured Assessments Results: The Patient Health Questionnaire-9 (PHQ-9) is a self-report measure that assesses symptoms and severity of depression over the course of the last two weeks. Norma Cohen obtained a score of ***  suggesting {GBPHQ9SEVERITY:21752}. Norma Cohen finds the endorsed symptoms to be {gbphq9difficulty:21754}. [0= Not at all; 1= Several days; 2= More than half the days; 3= Nearly every day] Little interest or pleasure in doing things ***  Feeling down, depressed, or hopeless ***  Trouble falling or staying asleep, or sleeping too much ***  Feeling tired or having little energy ***  Poor appetite or overeating ***  Feeling bad about yourself --- or that you are a failure or have let yourself or your family down ***  Trouble concentrating on things, such as reading the newspaper or watching television ***  Moving or speaking so slowly that other people could have noticed? Or the opposite --- being so fidgety or restless that you have been moving around a lot more than usual ***  Thoughts that you would be better off dead or hurting yourself in some way ***  PHQ-9 Score ***    The Generalized Anxiety Disorder-7 (GAD-7) is a brief self-report measure that assesses symptoms of anxiety over the course of the last two weeks. Norma Cohen obtained a score of *** suggesting {gbgad7severity:21753}. Norma Cohen finds the endorsed symptoms to be {gbphq9difficulty:21754}. [0= Not at all; 1= Several days; 2= Over half the days; 3= Nearly every day] Feeling nervous, anxious, on edge ***  Not being able to stop or control worrying ***  Worrying too much about different things ***  Trouble relaxing ***  Being so restless that it's hard to sit still ***  Becoming easily annoyed or irritable ***  Feeling afraid as if something awful might happen ***  GAD-7 Score ***   Interventions:  {Interventions for Progress Notes:23405}  DSM-5 Diagnosis(es): 311 (F32.8) Other Specified Depressive Disorder, Emotional Eating Behaviors and 314.01 (F90.9) Unspecified Attention-Deficit/Hyperactivity Disorder   Treatment Goal & Progress: During the initial appointment with this provider, the following treatment goal was established: increase  coping skills. Norma Cohen has demonstrated progress in her goal as evidenced by {gbtxprogress:22839}. Norma Cohen also {gbtxprogress2:22951}.  Plan: The next appointment will  be scheduled in {gbweeks:21758}, which will be {gbtxmodality:23402}. The next session will focus on {Plan for Next Appointment:23400}.

## 2020-08-18 ENCOUNTER — Encounter (INDEPENDENT_AMBULATORY_CARE_PROVIDER_SITE_OTHER): Payer: Self-pay | Admitting: Physician Assistant

## 2020-08-20 ENCOUNTER — Ambulatory Visit (INDEPENDENT_AMBULATORY_CARE_PROVIDER_SITE_OTHER): Payer: 59 | Admitting: Physician Assistant

## 2020-08-26 ENCOUNTER — Other Ambulatory Visit (INDEPENDENT_AMBULATORY_CARE_PROVIDER_SITE_OTHER): Payer: Self-pay | Admitting: Physician Assistant

## 2020-08-26 ENCOUNTER — Other Ambulatory Visit (INDEPENDENT_AMBULATORY_CARE_PROVIDER_SITE_OTHER): Payer: Self-pay

## 2020-08-26 DIAGNOSIS — E559 Vitamin D deficiency, unspecified: Secondary | ICD-10-CM

## 2020-08-26 NOTE — Telephone Encounter (Signed)
Would you like for me to refill her rx?

## 2020-08-26 NOTE — Telephone Encounter (Signed)
Yes please

## 2020-08-26 NOTE — Telephone Encounter (Signed)
Would you like for me to refill her rx?

## 2020-08-27 ENCOUNTER — Telehealth (INDEPENDENT_AMBULATORY_CARE_PROVIDER_SITE_OTHER): Payer: Self-pay | Admitting: Psychology

## 2020-08-27 NOTE — Telephone Encounter (Signed)
°  Office: [phone]  /  Fax: [phone]  Date of Call: August 27, 2020  Time of Call: 2:44pm Duration of Call: ~1 minute Provider: Lawerance Cruel, PsyD  CONTENT: This provider called Milly to check-in and schedule a follow-up appointment. She noted a plan to call back as she was at work. No evidence or endorsement of safety concerns.   PLAN: This provider will wait for Karie to call back. No further follow-up planned by this provider.

## 2020-08-27 NOTE — Telephone Encounter (Signed)
°  Office: [phone]  /  Fax: [phone]  Date of Call: August 27, 2020  Time of Call: 2:50pm Duration of Call: ~2 minutes Provider: Lawerance Cruel, PsyD  CONTENT: Norma Cohen returned this provider's call. She explained she cancelled her recent appointment due to insurance changes. Norma Cohen declined to schedule a follow-up appointment at this time as she is unsure about her mental health coverage benefits; however, reported a plan to keep her appointment with [facility] on September 11, 2020. No evidence or endorsement of safety concerns.   PLAN: Norma Cohen acknowledged understanding that she may request a follow-up appointment with this provider in the future (following this provider's maternity leave as previously discussed) as long as she is still established with the clinic. No further follow-up planned by this provider.

## 2020-09-03 ENCOUNTER — Ambulatory Visit (INDEPENDENT_AMBULATORY_CARE_PROVIDER_SITE_OTHER): Payer: 59 | Admitting: Physician Assistant

## 2020-09-11 ENCOUNTER — Ambulatory Visit: Payer: 59 | Admitting: Psychologist

## 2020-09-30 ENCOUNTER — Encounter (INDEPENDENT_AMBULATORY_CARE_PROVIDER_SITE_OTHER): Payer: Self-pay | Admitting: Physician Assistant

## 2020-12-23 ENCOUNTER — Other Ambulatory Visit: Payer: Self-pay | Admitting: Physician Assistant

## 2020-12-23 ENCOUNTER — Ambulatory Visit
Admission: RE | Admit: 2020-12-23 | Discharge: 2020-12-23 | Disposition: A | Discharge status: Home / Self Care | Payer: PRIVATE HEALTH INSURANCE | Source: Ambulatory Visit | Attending: Physician Assistant | Admitting: Physician Assistant

## 2020-12-23 DIAGNOSIS — R053 Chronic cough: Secondary | ICD-10-CM

## 2021-01-14 ENCOUNTER — Other Ambulatory Visit (INDEPENDENT_AMBULATORY_CARE_PROVIDER_SITE_OTHER): Payer: Self-pay | Admitting: Physician Assistant

## 2021-01-14 ENCOUNTER — Encounter (INDEPENDENT_AMBULATORY_CARE_PROVIDER_SITE_OTHER): Payer: Self-pay | Admitting: Family Medicine

## 2021-01-14 ENCOUNTER — Ambulatory Visit (INDEPENDENT_AMBULATORY_CARE_PROVIDER_SITE_OTHER): Payer: 59 | Admitting: Family Medicine

## 2021-01-14 ENCOUNTER — Other Ambulatory Visit: Payer: Self-pay

## 2021-01-14 VITALS — BP 147/100 | HR 77 | Temp 97.4°F | Ht 62.0 in | Wt 231.0 lb

## 2021-01-14 DIAGNOSIS — E559 Vitamin D deficiency, unspecified: Secondary | ICD-10-CM

## 2021-01-14 DIAGNOSIS — Z6839 Body mass index (BMI) 39.0-39.9, adult: Secondary | ICD-10-CM

## 2021-01-14 DIAGNOSIS — E8881 Metabolic syndrome: Secondary | ICD-10-CM | POA: Diagnosis not present

## 2021-01-14 DIAGNOSIS — Z9189 Other specified personal risk factors, not elsewhere classified: Secondary | ICD-10-CM | POA: Diagnosis not present

## 2021-01-14 DIAGNOSIS — F3289 Other specified depressive episodes: Secondary | ICD-10-CM | POA: Diagnosis not present

## 2021-01-14 DIAGNOSIS — R030 Elevated blood-pressure reading, without diagnosis of hypertension: Secondary | ICD-10-CM

## 2021-01-14 DIAGNOSIS — E88819 Insulin resistance, unspecified: Secondary | ICD-10-CM

## 2021-01-14 MED ORDER — METFORMIN HCL 500 MG PO TABS: 500.0000 mg | ORAL_TABLET | Freq: Every day | ORAL | 0 refills | Status: DC

## 2021-01-14 MED ORDER — VITAMIN D (ERGOCALCIFEROL) 1.25 MG (50000 UNIT) PO CAPS: 50000.0000 [IU] | ORAL_CAPSULE | ORAL | 0 refills | Status: DC

## 2021-01-14 NOTE — Telephone Encounter (Signed)
Last seen by Dawn Whitmire, FNP 

## 2021-01-21 ENCOUNTER — Encounter (INDEPENDENT_AMBULATORY_CARE_PROVIDER_SITE_OTHER): Payer: Self-pay | Admitting: Family Medicine

## 2021-01-21 NOTE — Progress Notes (Signed)
Chief Complaint:   OBESITY Norma Cohen is here to discuss her progress with her obesity treatment plan along with follow-up of her obesity related diagnoses. Norma Cohen is on keeping a food journal and adhering to recommended goals of 1800 calories and 115 grams of protein and states she is following her eating plan approximately 40% of the time. Norma Cohen states she is doing cardio and strength training for 30 minutes 3 times per week.  Today's visit was #: 24 Starting weight: 218 lbs Starting date: 05/24/2019 Today's weight: 231 lbs Today's date: 01/14/2021 Total lbs lost to date: +13 Total lbs lost since last in-office visit: +17  Interim History: Norma Cohen's last in office visit was on August 05, 2020.  She is up 17 pounds today.  She says she has had several big life changes over the past several months.  Lunch is difficult to get in due to work.  She notes she tends to binge when she gets home from work.  Subjective:   1. Insulin resistance Last fasting insulin was 43.9 and A1c was 5.3 (08/05/2020).  Has been off metformin for 3-4 months.  Lab Results  Component Value Date   INSULIN 43.9 (H) 08/05/2020   INSULIN 25.2 (H) 02/05/2020   INSULIN 34.3 (H) 10/02/2019   INSULIN 31.7 (H) 06/21/2019   INSULIN CANCELED 05/24/2019   Lab Results  Component Value Date   HGBA1C 5.3 08/05/2020   2. Vitamin D deficiency Has been off of vitamin D.  3. Elevated blood-pressure reading without diagnosis of hypertension Reports she was very nervous about coming in today. Blood pressure 147/100 today.  On Vyvanse.  BP Readings from Last 3 Encounters:  01/14/21 (!) 147/100  08/05/20 (!) 131/92  07/15/20 139/88   4. Other depression, with emotional eating She will start seeing a therapist soon for her issues with eating, and she has finally realized that she has "issues" with eating.  She is on Vyvanse for ADHD.  5. At risk for malnutrition Norma Cohen is at increased risk for malnutrition due to poor diet  and binge-type eating.  Assessment/Plan:   1. Insulin resistance Will refill metformin today, as per below.  Check fasting insulin level at next office visit.  - Refill metFORMIN (GLUCOPHAGE) 500 MG tablet; Take 1 tablet (500 mg total) by mouth daily with breakfast.  Dispense: 30 tablet; Refill: 0  2. Vitamin D deficiency Refill vitamin D 50,000 IU weekly.  Check vitamin D at next office visit.  - Refill Vitamin D, Ergocalciferol, (DRISDOL) 1.25 MG (50000 UNIT) CAPS capsule; Take 1 capsule (50,000 Units total) by mouth every 7 (seven) days.  Dispense: 4 capsule; Refill: 0  3. Elevated blood-pressure reading without diagnosis of hypertension Continue to monitor.  4. Other depression, with emotional eating Follow-up with counselor.  Continue Vyvanse.   5. At risk for malnutrition Norma Cohen was given approximately 15 minutes of counseling today regarding prevention of malnutrition and ways to meet macronutrient goals.  6. Obesity: BMI 42  Norma Cohen is currently in the action stage of change. As such, her goal is to continue with weight loss efforts. She has agreed to the Category 4 Plan and keeping a food journal and adhering to recommended goals of 550-700 calories and 45 grams of protein.   Exercise goals: As is.  Behavioral modification strategies: increasing lean protein intake and decreasing simple carbohydrates.  Norma Cohen has agreed to follow-up with our clinic in 3 weeks.   Objective:   Blood pressure (!) 147/100, pulse 77, temperature (!)  97.4 F (36.3 C), height 5\' 2"  (1.575 m), weight 231 lb (104.8 kg), SpO2 99 %. Body mass index is 42.25 kg/m.  General: Cooperative, alert, well developed, in no acute distress. HEENT: Conjunctivae and lids unremarkable. Cardiovascular: Regular rhythm.  Lungs: Normal work of breathing. Neurologic: No focal deficits.   Lab Results  Component Value Date   CREATININE 0.62 08/05/2020   BUN 16 08/05/2020   NA 139 08/05/2020   K 4.6 08/05/2020    CL 103 08/05/2020   CO2 23 08/05/2020   Lab Results  Component Value Date   ALT 26 08/05/2020   AST 17 08/05/2020   ALKPHOS 67 08/05/2020   BILITOT 0.3 08/05/2020   Lab Results  Component Value Date   HGBA1C 5.3 08/05/2020   HGBA1C 5.1 02/05/2020   HGBA1C 5.0 10/02/2019   HGBA1C 5.2 06/21/2019   HGBA1C 5.3 05/24/2019   Lab Results  Component Value Date   INSULIN 43.9 (H) 08/05/2020   INSULIN 25.2 (H) 02/05/2020   INSULIN 34.3 (H) 10/02/2019   INSULIN 31.7 (H) 06/21/2019   INSULIN CANCELED 05/24/2019   Lab Results  Component Value Date   TSH 3.020 06/21/2019   Lab Results  Component Value Date   CHOL 231 (H) 08/05/2020   HDL 46 08/05/2020   LDLCALC 162 (H) 08/05/2020   TRIG 127 08/05/2020   CHOLHDL 5.0 (H) 08/05/2020   Lab Results  Component Value Date   WBC 9.7 05/24/2019   HGB 13.9 05/24/2019   HCT 42.2 05/24/2019   MCV 85 05/24/2019   Attestation Statements:   Reviewed by clinician on day of visit: allergies, medications, problem list, medical history, surgical history, family history, social history, and previous encounter notes.  I, 07/24/2019, CMA, am acting as Insurance claims handler for Energy manager, FNP.  I have reviewed the above documentation for accuracy and completeness, and I agree with the above. -  [place], FNP

## 2021-02-05 ENCOUNTER — Ambulatory Visit (INDEPENDENT_AMBULATORY_CARE_PROVIDER_SITE_OTHER): Payer: 59 | Admitting: Family Medicine

## 2021-02-09 ENCOUNTER — Other Ambulatory Visit (INDEPENDENT_AMBULATORY_CARE_PROVIDER_SITE_OTHER): Payer: Self-pay | Admitting: Family Medicine

## 2021-02-09 DIAGNOSIS — E559 Vitamin D deficiency, unspecified: Secondary | ICD-10-CM

## 2021-02-10 NOTE — Telephone Encounter (Signed)
Refill request

## 2021-02-10 NOTE — Telephone Encounter (Signed)
Norma Cohen 

## 2021-02-11 ENCOUNTER — Other Ambulatory Visit (INDEPENDENT_AMBULATORY_CARE_PROVIDER_SITE_OTHER): Payer: Self-pay | Admitting: Family Medicine

## 2021-02-11 DIAGNOSIS — E8881 Metabolic syndrome: Secondary | ICD-10-CM

## 2021-02-11 NOTE — Telephone Encounter (Signed)
Refill request

## 2021-02-25 ENCOUNTER — Encounter (INDEPENDENT_AMBULATORY_CARE_PROVIDER_SITE_OTHER): Payer: Self-pay | Admitting: Family Medicine

## 2021-02-25 ENCOUNTER — Telehealth (INDEPENDENT_AMBULATORY_CARE_PROVIDER_SITE_OTHER): Payer: 59 | Admitting: Family Medicine

## 2021-02-25 ENCOUNTER — Other Ambulatory Visit: Payer: Self-pay

## 2021-02-25 DIAGNOSIS — E559 Vitamin D deficiency, unspecified: Secondary | ICD-10-CM | POA: Diagnosis not present

## 2021-02-25 DIAGNOSIS — Z6839 Body mass index (BMI) 39.0-39.9, adult: Secondary | ICD-10-CM

## 2021-02-25 DIAGNOSIS — F3289 Other specified depressive episodes: Secondary | ICD-10-CM

## 2021-02-25 MED ORDER — BUPROPION HCL ER (SR) 150 MG PO TB12: 150.0000 mg | ORAL_TABLET | Freq: Every day | ORAL | 0 refills | Status: DC

## 2021-02-26 NOTE — Progress Notes (Signed)
TeleHealth Visit:  Due to the COVID-19 pandemic, this visit was completed with telemedicine (audio/video) technology to reduce patient and provider exposure as well as to preserve personal protective equipment.   Norma Cohen has verbally consented to this TeleHealth visit. The patient is located at home, the provider is located at the [facility] office. The participants in this visit include the listed provider and patient. The visit was conducted today via MyChart video.   Chief Complaint: OBESITY Norma Cohen is here to discuss her progress with her obesity treatment plan along with follow-up of her obesity related diagnoses. Norma Cohen is on the Category 4 Plan and keeping a food journal and adhering to recommended goals of 550-700 calories and 45 grams of protein at supper daily and states she is following her eating plan approximately 65-70% of the time. Norma Cohen states she is doing 0 minutes 0 times per week.  Today's visit was #: 25 Starting weight: 218 lbs Starting date: 05/24/2019  Interim History: Norma Cohen feels that she has maintained her weight. Last office visit was 01/09/2021, and before that her last office visit was 08/05/2020. She says she did well for a few weeks after her last office visit, but then she developed a sinus infection and was off the plan. She denies drinking sugar sweetened beverages. She notes her biggest issue is stress eating.  Subjective:   1. Other depression, with emotional eating Norma Cohen admits to not handling stress well. She tends to eat when she is stressed. She is having a hard time finding a counselor that takes her insurance.  2. Vitamin D deficiency Norma Cohen's Vit D level is nearly at goal at 46.3. She is on weekly prescription Vit D.  Assessment/Plan:   1. Other depression, with emotional eating Norma Cohen agreed to start bupropion 150 mg q AM with no refills. She will try to find a counselor. She has a list that was provided by Dr. Dewaine Conger.  - buPROPion  [facility] SR) 150 MG 12 hr tablet; Take 1 tablet (150 mg total) by mouth daily with breakfast.  Dispense: 30 tablet; Refill: 0  2. Vitamin D deficiency Norma Cohen agreed to continue taking prescription Vitamin D 50,000 IU every week and will follow-up for routine testing of Vitamin D, at least 2-3 times per year to avoid over-replacement.  3. Obesity: BMI 42.24 Norma Cohen is currently in the action stage of change. As such, her goal is to continue with weight loss efforts. She has agreed to the Category 4 Plan.   We discussed bariatric surgery briefly and she will consider.  Exercise goals: No exercise has been prescribed at this time.  Behavioral modification strategies: decreasing simple carbohydrates, keeping healthy foods in the home and emotional eating strategies.  Norma Cohen has agreed to follow-up with our clinic in 3 to 4 weeks.  Objective:   VITALS: Per patient if applicable, see vitals. GENERAL: Alert and in no acute distress. CARDIOPULMONARY: No increased WOB. Speaking in clear sentences.  PSYCH: Pleasant and cooperative. Speech normal rate and rhythm. Affect is appropriate. Insight and judgement are appropriate. Attention is focused, linear, and appropriate.  NEURO: Oriented as arrived to appointment on time with no prompting.   Lab Results  Component Value Date   CREATININE 0.62 08/05/2020   BUN 16 08/05/2020   NA 139 08/05/2020   K 4.6 08/05/2020   CL 103 08/05/2020   CO2 23 08/05/2020   Lab Results  Component Value Date   ALT 26 08/05/2020   AST 17 08/05/2020  ALKPHOS 67 08/05/2020   BILITOT 0.3 08/05/2020   Lab Results  Component Value Date   HGBA1C 5.3 08/05/2020   HGBA1C 5.1 02/05/2020   HGBA1C 5.0 10/02/2019   HGBA1C 5.2 06/21/2019   HGBA1C 5.3 05/24/2019   Lab Results  Component Value Date   INSULIN 43.9 (H) 08/05/2020   INSULIN 25.2 (H) 02/05/2020   INSULIN 34.3 (H) 10/02/2019   INSULIN 31.7 (H) 06/21/2019   INSULIN CANCELED 05/24/2019   Lab Results   Component Value Date   TSH 3.020 06/21/2019   Lab Results  Component Value Date   CHOL 231 (H) 08/05/2020   HDL 46 08/05/2020   LDLCALC 162 (H) 08/05/2020   TRIG 127 08/05/2020   CHOLHDL 5.0 (H) 08/05/2020   Lab Results  Component Value Date   WBC 9.7 05/24/2019   HGB 13.9 05/24/2019   HCT 42.2 05/24/2019   MCV 85 05/24/2019   No results found for: IRON, TIBC, FERRITIN  Attestation Statements:   Reviewed by clinician on day of visit: allergies, medications, problem list, medical history, surgical history, family history, social history, and previous encounter notes.   Trude Mcburney, am acting as Energy manager for [place], FNP-C.  I have reviewed the above documentation for accuracy and completeness, and I agree with the above. - Jesse Sans, FNP

## 2021-02-27 ENCOUNTER — Encounter (INDEPENDENT_AMBULATORY_CARE_PROVIDER_SITE_OTHER): Payer: Self-pay | Admitting: Family Medicine

## 2021-02-27 DIAGNOSIS — E559 Vitamin D deficiency, unspecified: Secondary | ICD-10-CM | POA: Insufficient documentation

## 2021-02-27 DIAGNOSIS — F32A Depression, unspecified: Secondary | ICD-10-CM | POA: Insufficient documentation

## 2021-03-15 ENCOUNTER — Other Ambulatory Visit (INDEPENDENT_AMBULATORY_CARE_PROVIDER_SITE_OTHER): Payer: Self-pay | Admitting: Family Medicine

## 2021-03-15 DIAGNOSIS — E559 Vitamin D deficiency, unspecified: Secondary | ICD-10-CM

## 2021-03-17 ENCOUNTER — Other Ambulatory Visit (INDEPENDENT_AMBULATORY_CARE_PROVIDER_SITE_OTHER): Payer: Self-pay | Admitting: Family Medicine

## 2021-03-17 DIAGNOSIS — E8881 Metabolic syndrome: Secondary | ICD-10-CM

## 2021-03-19 NOTE — Telephone Encounter (Signed)
Pt last seen by Dawn Whitmire, FNP.  

## 2021-03-25 ENCOUNTER — Encounter (INDEPENDENT_AMBULATORY_CARE_PROVIDER_SITE_OTHER): Payer: Self-pay | Admitting: Family Medicine

## 2021-03-25 ENCOUNTER — Ambulatory Visit (INDEPENDENT_AMBULATORY_CARE_PROVIDER_SITE_OTHER): Payer: 59 | Admitting: Family Medicine

## 2021-03-25 ENCOUNTER — Other Ambulatory Visit: Payer: Self-pay

## 2021-03-25 VITALS — BP 140/88 | HR 90 | Temp 98.1°F | Ht 62.0 in | Wt 225.0 lb

## 2021-03-25 DIAGNOSIS — F3289 Other specified depressive episodes: Secondary | ICD-10-CM | POA: Diagnosis not present

## 2021-03-25 DIAGNOSIS — Z6839 Body mass index (BMI) 39.0-39.9, adult: Secondary | ICD-10-CM | POA: Diagnosis not present

## 2021-03-25 DIAGNOSIS — E559 Vitamin D deficiency, unspecified: Secondary | ICD-10-CM | POA: Diagnosis not present

## 2021-03-25 MED ORDER — VITAMIN D (ERGOCALCIFEROL) 1.25 MG (50000 UNIT) PO CAPS
1.0000 | ORAL_CAPSULE | ORAL | 0 refills | Status: AC
Start: 2021-03-25 — End: ?

## 2021-03-31 ENCOUNTER — Encounter (INDEPENDENT_AMBULATORY_CARE_PROVIDER_SITE_OTHER): Payer: Self-pay | Admitting: Family Medicine

## 2021-03-31 NOTE — Progress Notes (Signed)
Chief Complaint:   OBESITY Norma Cohen is here to discuss her progress with her obesity treatment plan along with follow-up of her obesity related diagnoses. Norma Cohen is on the Category 4 Plan and states she is following her eating plan approximately 80% of the time. Norma Cohen states she is doing body groove (Zumba and Yoga) 30-35 minutes 2 times per week.  Today's visit was #: 26 Starting weight: 218 lbs Starting date: 05/24/2019 Today's weight: 225 lbs Today's date: 03/25/2021 Total lbs lost to date: 0 Total lbs lost since last in-office visit: 6  Interim History: Norma Cohen is down 6 lbs since her last office visit. She has made good choices when she traveled recently. She likes to use meat substitutes at times. She makes some substitutions but is doing a good job with those. Hunger is satisfied.  Subjective:   1. Vitamin D deficiency Norma Cohen's Vit D is slightly low at 46.3. She is on weekly prescription Vit D.  2. Other depression, with emotional eating Norma Cohen has not started bupropion yet. She is on Vyvanse for ADHD. She notes some stress eating. She is still looking for a Veterinary surgeon.   Assessment/Plan:   1. Vitamin D deficiency She agrees to continue to take prescription Vitamin D @50 ,000 IU every week and will follow-up for routine testing of Vitamin D, at least 2-3 times per year to avoid over-replacement.  - Vitamin D, Ergocalciferol, (DRISDOL) 1.25 MG (50000 UNIT) CAPS capsule; Take 1 capsule (50,000 Units total) by mouth every 7 (seven) days.  Dispense: 4 capsule; Refill: 0  2. Other depression, with emotional eating  -Norma Cohen will start bupropion the week of June 13th. Continue to look for a counselor.  3. Obesity: BMI 41.14  Norma Cohen is currently in the action stage of change. As such, her goal is to continue with weight loss efforts. She has agreed to the Category 4 Plan.   Exercise goals:  Try to exercise on the weekends.  Behavioral modification strategies: increasing lean protein  intake.  Norma Cohen has agreed to follow-up with our clinic in 3-4 weeks.   Objective:   Blood pressure 140/88, pulse 90, temperature 98.1 F (36.7 C), height 5\' 2"  (1.575 m), weight 225 lb (102.1 kg), SpO2 99 %. Body mass index is 41.15 kg/m.  General: Cooperative, alert, well developed, in no acute distress. HEENT: Conjunctivae and lids unremarkable. Cardiovascular: Regular rhythm.  Lungs: Normal work of breathing. Neurologic: No focal deficits.   Lab Results  Component Value Date   CREATININE 0.62 08/05/2020   BUN 16 08/05/2020   NA 139 08/05/2020   K 4.6 08/05/2020   CL 103 08/05/2020   CO2 23 08/05/2020   Lab Results  Component Value Date   ALT 26 08/05/2020   AST 17 08/05/2020   ALKPHOS 67 08/05/2020   BILITOT 0.3 08/05/2020   Lab Results  Component Value Date   HGBA1C 5.3 08/05/2020   HGBA1C 5.1 02/05/2020   HGBA1C 5.0 10/02/2019   HGBA1C 5.2 06/21/2019   HGBA1C 5.3 05/24/2019   Lab Results  Component Value Date   INSULIN 43.9 (H) 08/05/2020   INSULIN 25.2 (H) 02/05/2020   INSULIN 34.3 (H) 10/02/2019   INSULIN 31.7 (H) 06/21/2019   INSULIN CANCELED 05/24/2019   Lab Results  Component Value Date   TSH 3.020 06/21/2019   Lab Results  Component Value Date   CHOL 231 (H) 08/05/2020   HDL 46 08/05/2020   LDLCALC 162 (H) 08/05/2020   TRIG 127 08/05/2020   CHOLHDL  5.0 (H) 08/05/2020   Lab Results  Component Value Date   WBC 9.7 05/24/2019   HGB 13.9 05/24/2019   HCT 42.2 05/24/2019   MCV 85 05/24/2019   No results found for: IRON, TIBC, FERRITIN  Attestation Statements:   Reviewed by clinician on day of visit: allergies, medications, problem list, medical history, surgical history, family history, social history, and previous encounter notes.  Edmund Hilda, CMA, am acting as Energy manager for [place], FNP.  I have reviewed the above documentation for accuracy and completeness, and I agree with the above. -  Jesse Sans,  FNP

## 2021-04-03 ENCOUNTER — Other Ambulatory Visit (INDEPENDENT_AMBULATORY_CARE_PROVIDER_SITE_OTHER): Payer: Self-pay | Admitting: Family Medicine

## 2021-04-03 DIAGNOSIS — F3289 Other specified depressive episodes: Secondary | ICD-10-CM

## 2021-04-03 NOTE — Telephone Encounter (Signed)
Refill request

## 2021-04-23 ENCOUNTER — Encounter (INDEPENDENT_AMBULATORY_CARE_PROVIDER_SITE_OTHER): Payer: Self-pay

## 2021-04-25 ENCOUNTER — Other Ambulatory Visit (INDEPENDENT_AMBULATORY_CARE_PROVIDER_SITE_OTHER): Payer: Self-pay | Admitting: Family Medicine

## 2021-04-25 DIAGNOSIS — E559 Vitamin D deficiency, unspecified: Secondary | ICD-10-CM

## 2021-04-28 ENCOUNTER — Ambulatory Visit (INDEPENDENT_AMBULATORY_CARE_PROVIDER_SITE_OTHER): Payer: 59 | Admitting: Family Medicine

## 2021-04-28 ENCOUNTER — Encounter (INDEPENDENT_AMBULATORY_CARE_PROVIDER_SITE_OTHER): Payer: Self-pay

## 2021-04-28 NOTE — Telephone Encounter (Signed)
Last seen Dawn 

## 2021-04-28 NOTE — Telephone Encounter (Signed)
Message sent to pt-CAS 

## 2021-04-30 ENCOUNTER — Other Ambulatory Visit (INDEPENDENT_AMBULATORY_CARE_PROVIDER_SITE_OTHER): Payer: Self-pay | Admitting: Adult Health

## 2021-04-30 DIAGNOSIS — F3289 Other specified depressive episodes: Secondary | ICD-10-CM

## 2021-04-30 NOTE — Telephone Encounter (Signed)
Pt last seen by Dawn Whitmire, FNP.  

## 2021-05-01 ENCOUNTER — Other Ambulatory Visit (INDEPENDENT_AMBULATORY_CARE_PROVIDER_SITE_OTHER): Payer: Self-pay

## 2021-05-01 NOTE — Telephone Encounter (Signed)
LAST APPOINTMENT DATE: 04/25/2021  NEXT APPOINTMENT DATE: 05/15/2021   [id number] - [place], [place] - [address] AT [facility] [address] & [address] [place] [address] Phone: [phone] Fax: [phone]  Patient is requesting a refill of the following medications: Requested Prescriptions   Pending Prescriptions Disp Refills   buPROPion (WELLBUTRIN SR) 150 MG 12 hr tablet [Pharmacy Med Name: BUPROPION SR 150MG  TABLETS (12 H)] 30 tablet 0    Sig: TAKE 1 TABLET(150 MG) BY MOUTH DAILY WITH BREAKFAST    Date last filled: 04/03/21 Previously prescribed by 04/05/21, NP  Lab Results  Component Value Date   HGBA1C 5.3 08/05/2020   HGBA1C 5.1 02/05/2020   HGBA1C 5.0 10/02/2019   Lab Results  Component Value Date   LDLCALC 162 (H) 08/05/2020   CREATININE 0.62 08/05/2020   Lab Results  Component Value Date   VD25OH 46.3 08/05/2020   VD25OH 51.4 02/05/2020   VD25OH 44.6 10/02/2019    BP Readings from Last 3 Encounters:  03/25/21 140/88  01/14/21 (!) 147/100  08/05/20 (!) 131/92

## 2021-05-01 NOTE — Telephone Encounter (Signed)
Mychart message sent.

## 2021-05-15 ENCOUNTER — Ambulatory Visit (INDEPENDENT_AMBULATORY_CARE_PROVIDER_SITE_OTHER): Payer: 59 | Admitting: Family Medicine

## 2021-06-03 ENCOUNTER — Other Ambulatory Visit (INDEPENDENT_AMBULATORY_CARE_PROVIDER_SITE_OTHER): Payer: Self-pay | Admitting: Family Medicine

## 2021-06-03 DIAGNOSIS — F3289 Other specified depressive episodes: Secondary | ICD-10-CM

## 2021-06-03 NOTE — Telephone Encounter (Signed)
Pt last seen by Dawn Whitmire, FNP.  

## 2021-09-30 IMAGING — CR DG CHEST 2V
2 series · 2 of 2 positions shown · non-contrast
Comparison: 11/16/2008

CLINICAL DATA: Persistent cough

EXAM:
CHEST - 2 VIEW

[w chest pa]
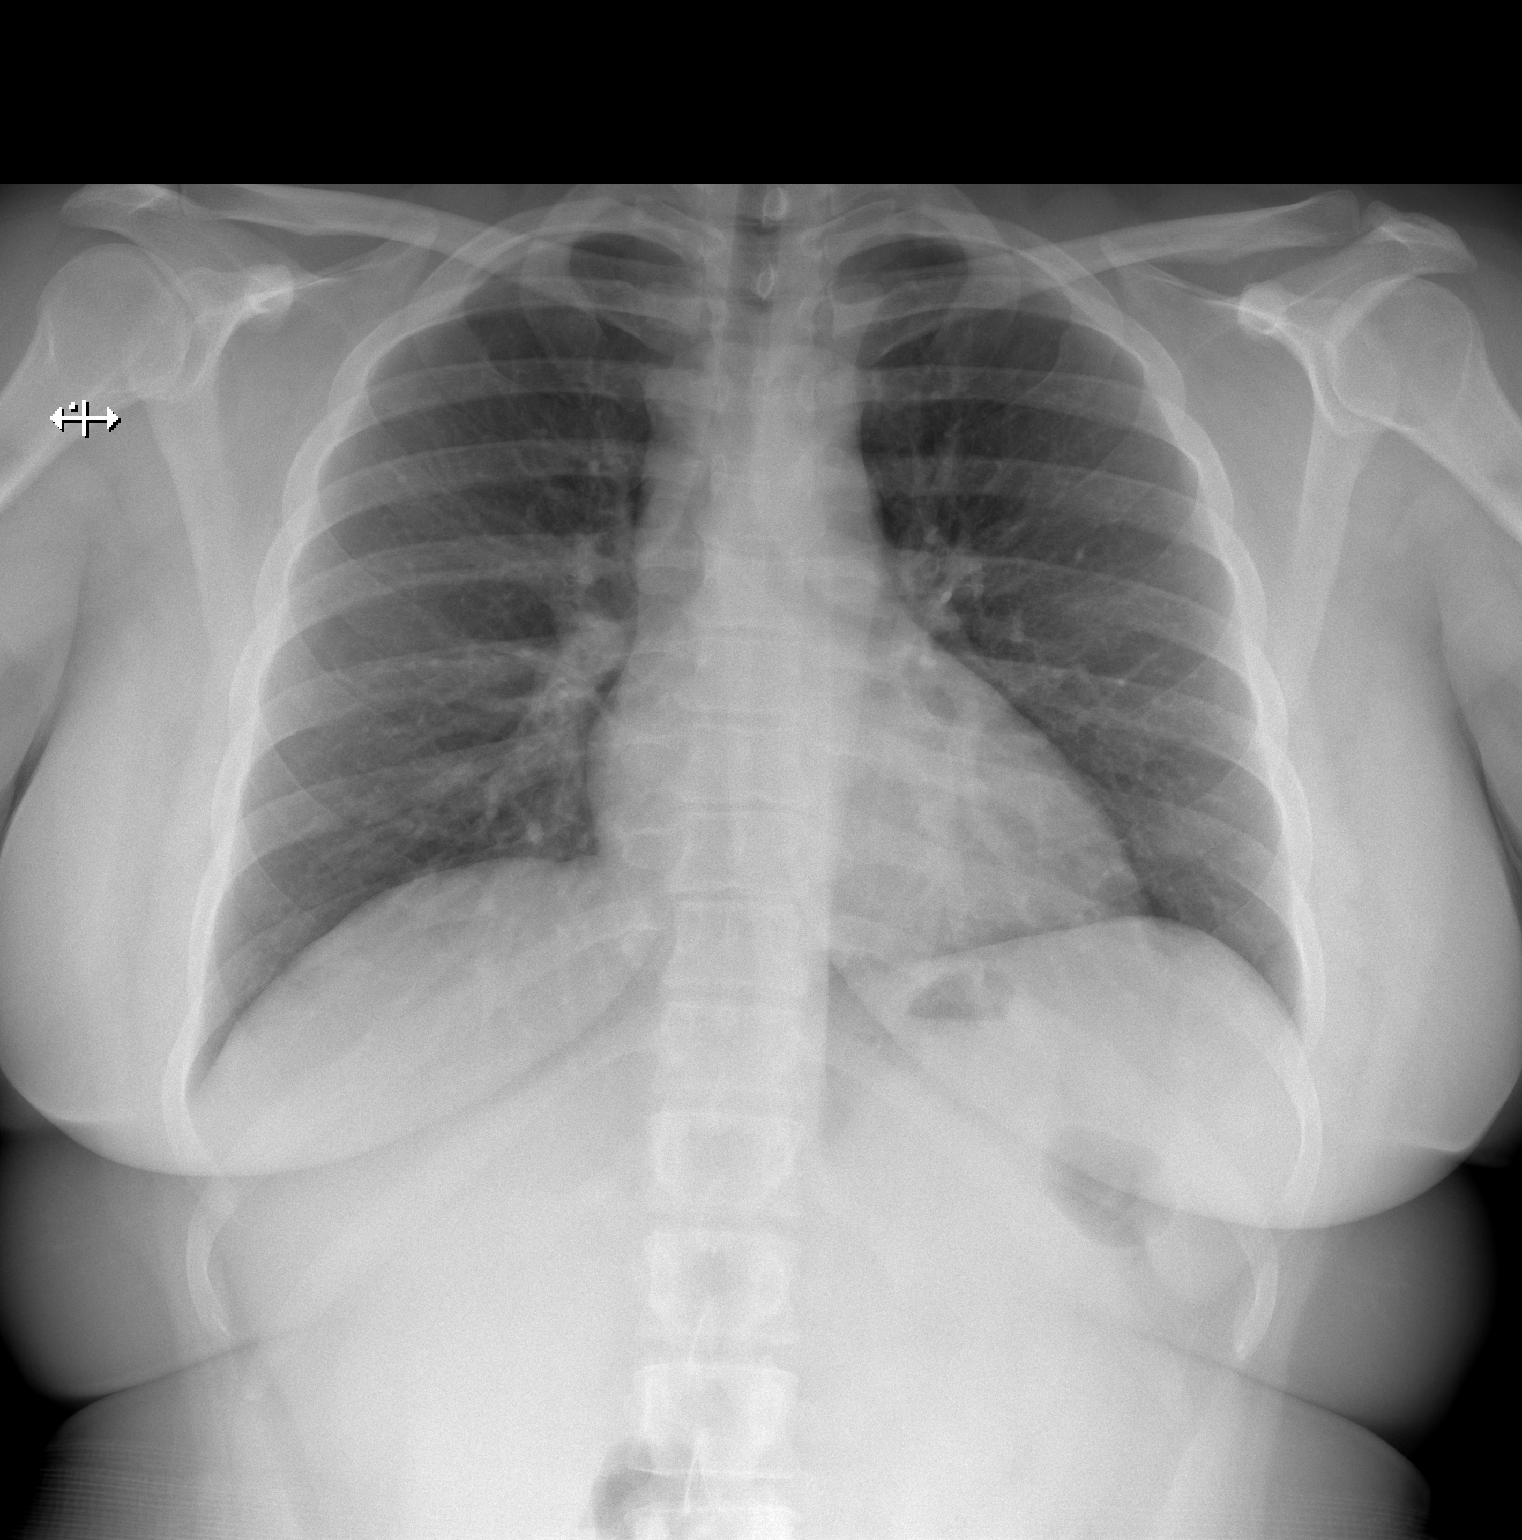

[w chest lat]
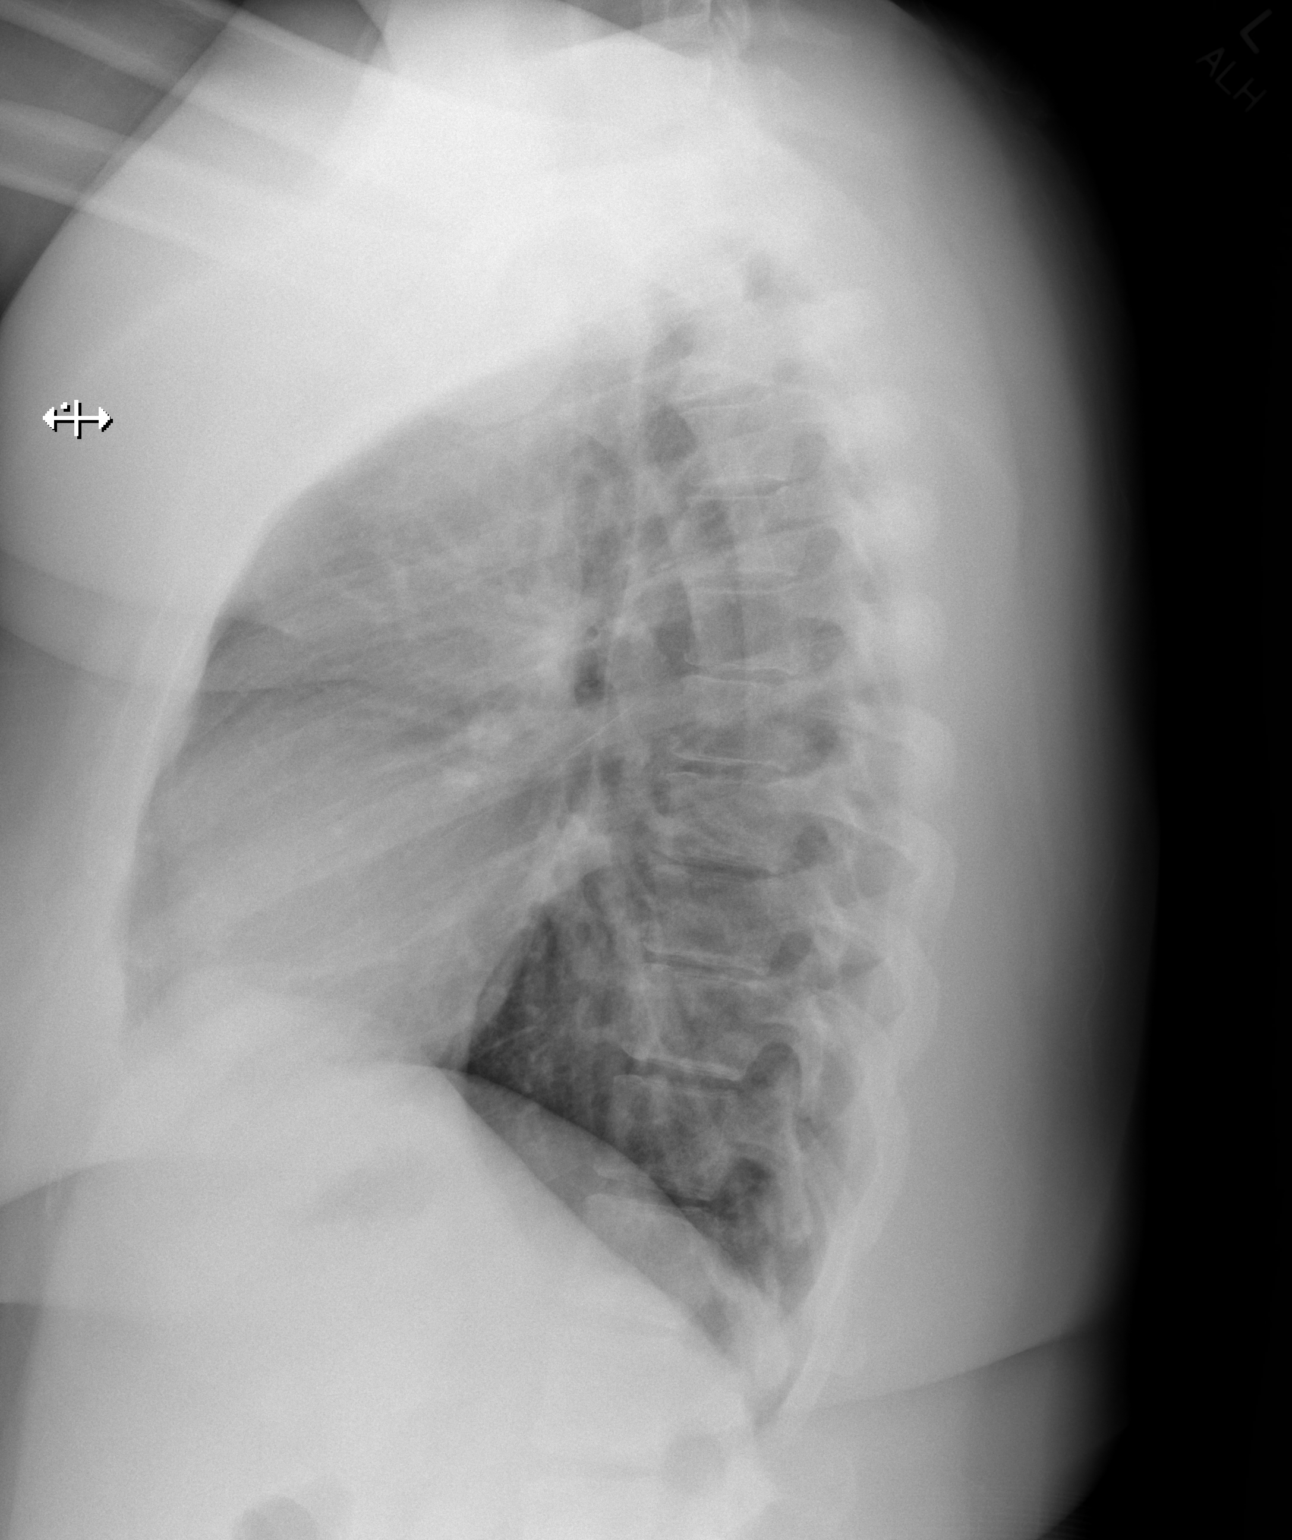

[2 of 2 positions shown; findings below may reference images not displayed]

FINDINGS: The heart size and mediastinal contours are within normal limits.
Both lungs are clear. The visualized skeletal structures are
unremarkable.
IMPRESSION: No acute cardiopulmonary process.

## 2022-05-27 ENCOUNTER — Encounter (INDEPENDENT_AMBULATORY_CARE_PROVIDER_SITE_OTHER): Payer: Self-pay

## 2023-07-06 DIAGNOSIS — N926 Irregular menstruation, unspecified: Secondary | ICD-10-CM | POA: Diagnosis not present

## 2023-07-06 DIAGNOSIS — F902 Attention-deficit hyperactivity disorder, combined type: Secondary | ICD-10-CM | POA: Diagnosis not present

## 2023-07-06 DIAGNOSIS — I10 Essential (primary) hypertension: Secondary | ICD-10-CM | POA: Diagnosis not present

## 2023-07-21 DIAGNOSIS — Z23 Encounter for immunization: Secondary | ICD-10-CM | POA: Diagnosis not present

## 2023-08-06 DIAGNOSIS — I10 Essential (primary) hypertension: Secondary | ICD-10-CM | POA: Diagnosis not present

## 2023-08-06 DIAGNOSIS — F902 Attention-deficit hyperactivity disorder, combined type: Secondary | ICD-10-CM | POA: Diagnosis not present

## 2023-08-24 DIAGNOSIS — Z713 Dietary counseling and surveillance: Secondary | ICD-10-CM | POA: Diagnosis not present

## 2023-10-06 DIAGNOSIS — F902 Attention-deficit hyperactivity disorder, combined type: Secondary | ICD-10-CM | POA: Diagnosis not present

## 2023-11-25 DIAGNOSIS — Z713 Dietary counseling and surveillance: Secondary | ICD-10-CM | POA: Diagnosis not present

## 2023-11-30 DIAGNOSIS — F902 Attention-deficit hyperactivity disorder, combined type: Secondary | ICD-10-CM | POA: Diagnosis not present

## 2024-02-01 DIAGNOSIS — F411 Generalized anxiety disorder: Secondary | ICD-10-CM | POA: Diagnosis not present

## 2024-02-01 DIAGNOSIS — I10 Essential (primary) hypertension: Secondary | ICD-10-CM | POA: Diagnosis not present

## 2024-02-01 DIAGNOSIS — F902 Attention-deficit hyperactivity disorder, combined type: Secondary | ICD-10-CM | POA: Diagnosis not present

## 2024-02-01 DIAGNOSIS — F321 Major depressive disorder, single episode, moderate: Secondary | ICD-10-CM | POA: Diagnosis not present

## 2024-02-24 DIAGNOSIS — I10 Essential (primary) hypertension: Secondary | ICD-10-CM | POA: Diagnosis not present

## 2024-02-24 DIAGNOSIS — B369 Superficial mycosis, unspecified: Secondary | ICD-10-CM | POA: Diagnosis not present

## 2024-07-05 DIAGNOSIS — Z23 Encounter for immunization: Secondary | ICD-10-CM | POA: Diagnosis not present

## 2024-08-14 DIAGNOSIS — F909 Attention-deficit hyperactivity disorder, unspecified type: Secondary | ICD-10-CM | POA: Diagnosis not present

## 2024-08-14 DIAGNOSIS — E7800 Pure hypercholesterolemia, unspecified: Secondary | ICD-10-CM | POA: Diagnosis not present

## 2024-08-14 DIAGNOSIS — F411 Generalized anxiety disorder: Secondary | ICD-10-CM | POA: Diagnosis not present

## 2024-08-14 DIAGNOSIS — M545 Low back pain, unspecified: Secondary | ICD-10-CM | POA: Diagnosis not present

## 2024-08-14 DIAGNOSIS — Z0000 Encounter for general adult medical examination without abnormal findings: Secondary | ICD-10-CM | POA: Diagnosis not present

## 2024-08-14 DIAGNOSIS — I10 Essential (primary) hypertension: Secondary | ICD-10-CM | POA: Diagnosis not present
